# Patient Record
Sex: Male | Born: 1990 | Race: White | Hispanic: No | Marital: Married | Smoking: Former smoker
Health system: Southern US, Community
[De-identification: ages and names within clinical notes are randomized; demographics above are authoritative.]

## PROBLEM LIST (undated history)

## (undated) DIAGNOSIS — Z8249 Family history of ischemic heart disease and other diseases of the circulatory system: Secondary | ICD-10-CM

## (undated) DIAGNOSIS — E559 Vitamin D deficiency, unspecified: Secondary | ICD-10-CM

## (undated) DIAGNOSIS — I441 Atrioventricular block, second degree: Secondary | ICD-10-CM

## (undated) DIAGNOSIS — R002 Palpitations: Secondary | ICD-10-CM

## (undated) DIAGNOSIS — R0683 Snoring: Secondary | ICD-10-CM

## (undated) DIAGNOSIS — R61 Generalized hyperhidrosis: Secondary | ICD-10-CM

## (undated) DIAGNOSIS — R7989 Other specified abnormal findings of blood chemistry: Secondary | ICD-10-CM

## (undated) DIAGNOSIS — J302 Other seasonal allergic rhinitis: Secondary | ICD-10-CM

## (undated) DIAGNOSIS — E781 Pure hyperglyceridemia: Secondary | ICD-10-CM

## (undated) HISTORY — DX: Other seasonal allergic rhinitis: J30.2

## (undated) HISTORY — DX: Other specified abnormal findings of blood chemistry: R79.89

## (undated) HISTORY — DX: Atrioventricular block, second degree: I44.1

## (undated) HISTORY — DX: Pure hyperglyceridemia: E78.1

## (undated) HISTORY — DX: Snoring: R06.83

## (undated) HISTORY — DX: Family history of ischemic heart disease and other diseases of the circulatory system: Z82.49

## (undated) HISTORY — DX: Generalized hyperhidrosis: R61

## (undated) HISTORY — PX: LEG SURGERY: SHX1003

## (undated) HISTORY — DX: Vitamin D deficiency, unspecified: E55.9

## (undated) HISTORY — DX: Palpitations: R00.2

---
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
## 2012-01-07 ENCOUNTER — Ambulatory Visit: Payer: Managed Care, Other (non HMO)

## 2012-01-07 ENCOUNTER — Ambulatory Visit (INDEPENDENT_AMBULATORY_CARE_PROVIDER_SITE_OTHER): Payer: Managed Care, Other (non HMO) | Admitting: Internal Medicine

## 2012-01-07 VITALS — BP 113/66 | HR 57 | Temp 97.9°F | Resp 16 | Ht 67.5 in | Wt 145.8 lb

## 2012-01-07 DIAGNOSIS — M79672 Pain in left foot: Secondary | ICD-10-CM

## 2012-01-07 DIAGNOSIS — M79609 Pain in unspecified limb: Secondary | ICD-10-CM

## 2012-01-07 MED ORDER — MELOXICAM 15 MG PO TABS: 15.0000 mg | ORAL_TABLET | Freq: Every day | ORAL | Status: AC

## 2012-01-07 NOTE — Progress Notes (Signed)
  Subjective:    Patient ID: Joshua Guerra, male    DOB: May 02, 1991, 21 y.o.   MRN: [id number]   HPI in because he can't walk without pain today. He played basketball yesterday afternoon and sometime after dinner began to have pain in his foot. His left foot is not swollen but he can't walk without significant pain.    Review of Systemsnoncontributory     Objective:   Physical Examthe left foot is not swollen but is extremely tender over the first 2 metatarsals and in the interosseous space. There is pain with any range of motion particularly dorsiflexion. The ankle is normal.        [facility] reading (PRIMARY) by  Dr.[place] Festa  =WNL  Assessment & Plan:  Problem #1 pain secondary to foot sprain  He will be put in a postop shoe and treated with Mobic. He should return in 10 days if not fully functional. 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

## 2012-01-07 NOTE — Patient Instructions (Addendum)
Foot Sprain °The muscles and cord like structures which attach muscle to bone (tendons) that surround the feet are made up of units. A foot sprain can occur at the weakest spot in any of these units. This condition is most often caused by injury to or overuse of the foot, as from playing contact sports, or aggravating a previous injury, or from poor conditioning, or obesity. °SYMPTOMS °· Pain with movement of the foot.  °· Tenderness and swelling at the injury site.  °· Loss of strength is present in moderate or severe sprains.  °THE THREE GRADES OR SEVERITY OF FOOT SPRAIN ARE: °· Mild (Grade I): Slightly pulled muscle without tearing of muscle or tendon fibers or loss of strength.  °· Moderate (Grade II): Tearing of fibers in a muscle, tendon, or at the attachment to bone, with small decrease in strength.  °· Severe (Grade III): Rupture of the muscle-tendon-bone attachment, with separation of fibers. Severe sprain requires surgical repair. Often repeating (chronic) sprains are caused by overuse. Sudden (acute) sprains are caused by direct injury or over-use.  °DIAGNOSIS  °Diagnosis of this condition is usually by your own observation. If problems continue, a caregiver may be required for further evaluation and treatment. X-rays may be required to make sure there are not breaks in the bones (fractures) present. Continued problems may require physical therapy for treatment. °PREVENTION °· Use strength and conditioning exercises appropriate for your sport.  °· Warm up properly prior to working out.  °· Use athletic shoes that are made for the sport you are participating in.  °· Allow adequate time for healing. Early return to activities makes repeat injury more likely, and can lead to an unstable arthritic foot that can result in prolonged disability. Mild sprains generally heal in 3 to 10 days, with moderate and severe sprains taking 2 to 10 weeks. Your caregiver can help you determine the proper time required for  healing.  °HOME CARE INSTRUCTIONS  °· Apply ice to the injury for 15 to 20 minutes, 3 to 4 times per day. Put the ice in a plastic bag and place a towel between the bag of ice and your skin.  °· An elastic wrap (like an Ace bandage) may be used to keep swelling down.  °· Keep foot above the level of the heart, or at least raised on a footstool, when swelling and pain are present.  °· Try to avoid use other than gentle range of motion while the foot is painful. Do not resume use until instructed by your caregiver. Then begin use gradually, not increasing use to the point of pain. If pain does develop, decrease use and continue the above measures, gradually increasing activities that do not cause discomfort, until you gradually achieve normal use.  °· Use crutches if and as instructed, and for the length of time instructed.  °· Keep injured foot and ankle wrapped between treatments.  °· Massage foot and ankle for comfort and to keep swelling down. Massage from the toes up towards the knee.  °· Only take over-the-counter or prescription medicines for pain, discomfort, or fever as directed by your caregiver.  °SEEK IMMEDIATE MEDICAL CARE IF:  °· Your pain and swelling increase, or pain is not controlled with medications.  °· You have loss of feeling in your foot or your foot turns cold or blue.  °· You develop new, unexplained symptoms, or an increase of the symptoms that brought you to your caregiver.  °MAKE SURE YOU:  °·   Understand these instructions.  °· Will watch your condition.  °· Will get help right away if you are not doing well or get worse.  °Document Released: 05/06/2002 Document Revised: 07/27/2011 Document Reviewed: 07/03/2008 °[employer]® Patient Information ©2012 [employer]. °

## 2013-06-08 ENCOUNTER — Inpatient Hospital Stay (HOSPITAL_COMMUNITY)
Admission: EM | Admit: 2013-06-08 | Discharge: 2013-06-12 | DRG: 194 | Disposition: A | Discharge status: Home / Self Care | Payer: Managed Care, Other (non HMO) | Attending: Internal Medicine | Admitting: Internal Medicine

## 2013-06-08 ENCOUNTER — Encounter (HOSPITAL_COMMUNITY): Payer: Self-pay | Admitting: *Deleted

## 2013-06-08 ENCOUNTER — Emergency Department (HOSPITAL_COMMUNITY): Payer: Managed Care, Other (non HMO)

## 2013-06-08 DIAGNOSIS — M609 Myositis, unspecified: Secondary | ICD-10-CM

## 2013-06-08 DIAGNOSIS — IMO0001 Reserved for inherently not codable concepts without codable children: Secondary | ICD-10-CM

## 2013-06-08 DIAGNOSIS — J189 Pneumonia, unspecified organism: Principal | ICD-10-CM | POA: Diagnosis present

## 2013-06-08 DIAGNOSIS — F17200 Nicotine dependence, unspecified, uncomplicated: Secondary | ICD-10-CM | POA: Diagnosis present

## 2013-06-08 DIAGNOSIS — R233 Spontaneous ecchymoses: Secondary | ICD-10-CM | POA: Diagnosis present

## 2013-06-08 DIAGNOSIS — E871 Hypo-osmolality and hyponatremia: Secondary | ICD-10-CM | POA: Diagnosis present

## 2013-06-08 DIAGNOSIS — Z79899 Other long term (current) drug therapy: Secondary | ICD-10-CM

## 2013-06-08 DIAGNOSIS — D696 Thrombocytopenia, unspecified: Secondary | ICD-10-CM | POA: Diagnosis present

## 2013-06-08 DIAGNOSIS — B349 Viral infection, unspecified: Secondary | ICD-10-CM

## 2013-06-08 DIAGNOSIS — B9789 Other viral agents as the cause of diseases classified elsewhere: Secondary | ICD-10-CM | POA: Diagnosis present

## 2013-06-08 DIAGNOSIS — IMO0002 Reserved for concepts with insufficient information to code with codable children: Secondary | ICD-10-CM | POA: Diagnosis present

## 2013-06-08 DIAGNOSIS — R200 Anesthesia of skin: Secondary | ICD-10-CM

## 2013-06-08 DIAGNOSIS — M549 Dorsalgia, unspecified: Secondary | ICD-10-CM

## 2013-06-08 DIAGNOSIS — R209 Unspecified disturbances of skin sensation: Secondary | ICD-10-CM | POA: Diagnosis present

## 2013-06-08 DIAGNOSIS — R29898 Other symptoms and signs involving the musculoskeletal system: Secondary | ICD-10-CM

## 2013-06-08 LAB — URINALYSIS, ROUTINE W REFLEX MICROSCOPIC
Bilirubin Urine: NEGATIVE
Glucose, UA: NEGATIVE mg/dL
Hgb urine dipstick: NEGATIVE
Ketones, ur: NEGATIVE mg/dL
Specific Gravity, Urine: 1.026 (ref 1.005–1.030)
pH: 7 (ref 5.0–8.0)

## 2013-06-08 LAB — CBC WITH DIFFERENTIAL/PLATELET
Basophils Absolute: 0 10*3/uL (ref 0.0–0.1)
Eosinophils Relative: 0 % (ref 0–5)
HCT: 44.8 % (ref 39.0–52.0)
Lymphocytes Relative: 13 % (ref 12–46)
Lymphs Abs: 0.8 10*3/uL (ref 0.7–4.0)
MCV: 82.1 fL (ref 78.0–100.0)
Monocytes Absolute: 1 10*3/uL (ref 0.1–1.0)
Neutro Abs: 4.3 10*3/uL (ref 1.7–7.7)
Platelets: 127 10*3/uL — ABNORMAL LOW (ref 150–400)
RBC: 5.46 MIL/uL (ref 4.22–5.81)
WBC: 6.2 10*3/uL (ref 4.0–10.5)

## 2013-06-08 LAB — COMPREHENSIVE METABOLIC PANEL
ALT: 15 U/L (ref 0–53)
AST: 26 U/L (ref 0–37)
CO2: 21 mEq/L (ref 19–32)
Calcium: 9.7 mg/dL (ref 8.4–10.5)
Chloride: 100 mEq/L (ref 96–112)
GFR calc Af Amer: >90 mL/min (ref >90)
GFR calc non Af Amer: >90 mL/min (ref >90)
Glucose, Bld: 84 mg/dL (ref 70–99)
Sodium: 133 mEq/L — ABNORMAL LOW (ref 135–145)
Total Bilirubin: 0.5 mg/dL (ref 0.3–1.2)

## 2013-06-08 LAB — CSF CELL COUNT WITH DIFFERENTIAL: WBC, CSF: 2 /mm3 (ref 0–5)

## 2013-06-08 LAB — GRAM STAIN: Gram Stain: NONE SEEN

## 2013-06-08 LAB — HIV ANTIBODY (ROUTINE TESTING W REFLEX): HIV: NONREACTIVE

## 2013-06-08 LAB — SEDIMENTATION RATE: Sed Rate: 1 mm/hr (ref 0–16)

## 2013-06-08 LAB — RAPID URINE DRUG SCREEN, HOSP PERFORMED
Amphetamines: NOT DETECTED
Barbiturates: NOT DETECTED
Benzodiazepines: POSITIVE — AB
Cocaine: NOT DETECTED
Tetrahydrocannabinol: POSITIVE — AB

## 2013-06-08 LAB — CRYPTOCOCCAL ANTIGEN, CSF: Crypto Ag: NEGATIVE

## 2013-06-08 LAB — CK: Total CK: 140 U/L (ref 7–232)

## 2013-06-08 LAB — APTT: aPTT: 28 seconds (ref 24–37)

## 2013-06-08 LAB — MAGNESIUM: Magnesium: 1.9 mg/dL (ref 1.5–2.5)

## 2013-06-08 LAB — PROTEIN AND GLUCOSE, CSF: Glucose, CSF: 67 mg/dL (ref 43–76)

## 2013-06-08 MED ORDER — ENOXAPARIN SODIUM 40 MG/0.4ML ~~LOC~~ SOLN
40.0000 mg | SUBCUTANEOUS | Status: DC
  Administered 2013-06-09 – 2013-06-11 (×3): 40 mg via SUBCUTANEOUS
  Filled 2013-06-08 (×5): qty 0.4

## 2013-06-08 MED ORDER — HYDROMORPHONE HCL PF 2 MG/ML IJ SOLN
2.0000 mg | Freq: Once | INTRAMUSCULAR | Status: AC
  Administered 2013-06-08: 2 mg via INTRAMUSCULAR
  Filled 2013-06-08: qty 1

## 2013-06-08 MED ORDER — ONDANSETRON HCL 4 MG PO TABS: 4.0000 mg | ORAL_TABLET | Freq: Four times a day (QID) | ORAL | Status: DC | PRN

## 2013-06-08 MED ORDER — DIAZEPAM 2 MG PO TABS
2.0000 mg | ORAL_TABLET | Freq: Once | ORAL | Status: AC
  Administered 2013-06-08: 2 mg via ORAL
  Filled 2013-06-08: qty 1

## 2013-06-08 MED ORDER — KETOROLAC TROMETHAMINE 30 MG/ML IJ SOLN
30.0000 mg | Freq: Once | INTRAMUSCULAR | Status: AC
  Administered 2013-06-08: 30 mg via INTRAVENOUS
  Filled 2013-06-08: qty 1

## 2013-06-08 MED ORDER — HYDROMORPHONE HCL PF 1 MG/ML IJ SOLN
1.0000 mg | Freq: Once | INTRAMUSCULAR | Status: AC
  Administered 2013-06-08: 1 mg via INTRAVENOUS
  Filled 2013-06-08: qty 1

## 2013-06-08 MED ORDER — LORAZEPAM 2 MG/ML IJ SOLN
1.0000 mg | Freq: Four times a day (QID) | INTRAMUSCULAR | Status: DC | PRN
  Administered 2013-06-09 – 2013-06-11 (×6): 1 mg via INTRAVENOUS
  Filled 2013-06-08 (×6): qty 1

## 2013-06-08 MED ORDER — GADOBENATE DIMEGLUMINE 529 MG/ML IV SOLN
13.0000 mL | Freq: Once | INTRAVENOUS | Status: AC
  Administered 2013-06-08: 13 mL via INTRAVENOUS

## 2013-06-08 MED ORDER — SODIUM CHLORIDE 0.9 % IV BOLUS (SEPSIS)
1000.0000 mL | Freq: Once | INTRAVENOUS | Status: AC
  Administered 2013-06-08: 1000 mL via INTRAVENOUS

## 2013-06-08 MED ORDER — DIPHENHYDRAMINE HCL 25 MG PO CAPS
25.0000 mg | ORAL_CAPSULE | Freq: Four times a day (QID) | ORAL | Status: DC | PRN
  Administered 2013-06-08 – 2013-06-10 (×4): 25 mg via ORAL
  Filled 2013-06-08 (×4): qty 1

## 2013-06-08 MED ORDER — ONDANSETRON HCL 4 MG/2ML IJ SOLN: 4.0000 mg | Freq: Four times a day (QID) | INTRAMUSCULAR | Status: DC | PRN

## 2013-06-08 MED ORDER — CYCLOBENZAPRINE HCL 5 MG PO TABS
5.0000 mg | ORAL_TABLET | ORAL | Status: DC | PRN
  Administered 2013-06-08: 5 mg via ORAL
  Filled 2013-06-08: qty 1

## 2013-06-08 MED ORDER — HYDROCODONE-ACETAMINOPHEN 5-325 MG PO TABS
1.0000 | ORAL_TABLET | ORAL | Status: DC | PRN
  Administered 2013-06-09: 2 via ORAL
  Filled 2013-06-08: qty 2

## 2013-06-08 MED ORDER — SODIUM CHLORIDE 0.9 % IV SOLN
INTRAVENOUS | Status: DC
  Administered 2013-06-08 – 2013-06-10 (×4): via INTRAVENOUS

## 2013-06-08 MED ORDER — HYDROMORPHONE HCL PF 2 MG/ML IJ SOLN
2.0000 mg | INTRAMUSCULAR | Status: DC | PRN
  Administered 2013-06-08: 2 mg via INTRAVENOUS
  Administered 2013-06-08 (×2): 3 mg via INTRAVENOUS
  Administered 2013-06-09 (×3): 2 mg via INTRAVENOUS
  Administered 2013-06-09: 3 mg via INTRAVENOUS
  Administered 2013-06-09: 2 mg via INTRAVENOUS
  Administered 2013-06-10: 3 mg via INTRAVENOUS
  Administered 2013-06-10: 2 mg via INTRAVENOUS
  Filled 2013-06-08 (×2): qty 2
  Filled 2013-06-08 (×5): qty 1
  Filled 2013-06-08: qty 2
  Filled 2013-06-08: qty 1

## 2013-06-08 MED ORDER — MIDAZOLAM HCL 2 MG/2ML IJ SOLN
2.0000 mg | Freq: Once | INTRAMUSCULAR | Status: AC
  Administered 2013-06-08: 2 mg via INTRAVENOUS
  Filled 2013-06-08: qty 2

## 2013-06-08 MED ORDER — CAMPHOR-MENTHOL 0.5-0.5 % EX LOTN
TOPICAL_LOTION | CUTANEOUS | Status: DC | PRN
  Administered 2013-06-09 – 2013-06-10 (×2): via TOPICAL
  Filled 2013-06-08: qty 222

## 2013-06-08 MED ORDER — SODIUM CHLORIDE 0.9 % IV SOLN
Freq: Once | INTRAVENOUS | Status: AC
  Administered 2013-06-08: 09:00:00 via INTRAVENOUS

## 2013-06-08 MED ORDER — HYDROMORPHONE HCL PF 2 MG/ML IJ SOLN
INTRAMUSCULAR | Status: AC
  Filled 2013-06-08: qty 1

## 2013-06-08 MED ORDER — HYDROMORPHONE HCL PF 1 MG/ML IJ SOLN
1.0000 mg | INTRAMUSCULAR | Status: DC | PRN
  Filled 2013-06-08: qty 1

## 2013-06-08 NOTE — ED Notes (Signed)
Pt has attempted to urinate several times.  Sts unable to provide a sample.

## 2013-06-08 NOTE — ED Notes (Signed)
Neuro MD at bedside

## 2013-06-08 NOTE — ED Notes (Signed)
Patient transported to CT 

## 2013-06-08 NOTE — ED Provider Notes (Addendum)
History    CSN: [id number] Arrival date & time 06/08/13  0726  First MD Initiated Contact with Patient 06/08/13 [phone]     Chief Complaint  Patient presents with  . headache/neck pain/ back pain    (Consider location/radiation/quality/duration/timing/severity/associated sxs/prior Treatment) HPI Comments: Pt comes in with cc of headaches, neck pain, back pain, weakness, numbness. He has no medical hx, major surgical hx, and denies any medication use, illicit substance abuse. Pt's sx started about a week ago with a headache. The headaches have been mostly constant, waxing and waning, 7/10 at it's worst. The headaches are dull, and mostly in the posterior part of the head. No associated nausea, vomiting, visual complains, seizures, altered mental status, loss of consciousness. No known fevers. Pt has lower back pain, severe - that radiates upto his neck. The pain is sharp, and is actually distributing the entire back. The pain has been present for 3 days, and acutely got worse today, at it's worst it is at 10/10. No associated urinary incontinence, urinary retention, bowel incontinence. Pt does endorse having facial numbness starting today, bilateral upper extremity numbness, and bilateral leg weakness. No tick bites, no recent infection, no outdoor activity or travels, no family hx of any neuro medical conditions.      The history is provided by the patient.   History reviewed. No pertinent past medical history. Past Surgical History  Procedure Laterality Date  . Leg surgery      2 screws in left leg, 2010   History reviewed. No pertinent family history. History  Substance Use Topics  . Smoking status: Current Every Day Smoker -- 0.50 packs/day for 5 years    Types: Cigarettes  . Smokeless tobacco: Not on file     Comment: smokes about a 1/2 pack a day  . Alcohol Use: No    Review of Systems  Constitutional: Negative for fever, chills and activity change.  HENT: Positive for  neck pain and neck stiffness.   Eyes: Negative for visual disturbance.  Respiratory: Negative for cough, chest tightness and shortness of breath.   Cardiovascular: Negative for chest pain.  Gastrointestinal: Negative for abdominal distention.  Genitourinary: Negative for dysuria, enuresis and difficulty urinating.  Musculoskeletal: Positive for myalgias, back pain and gait problem. Negative for arthralgias.  Skin: Negative for rash.  Neurological: Positive for weakness, numbness and headaches. Negative for dizziness and light-headedness.  Psychiatric/Behavioral: Negative for confusion.    Allergies  Review of patient's allergies indicates no known allergies.  Home Medications   Current Outpatient Rx  Name  Route  Sig  Dispense  Refill  . diphenhydrAMINE (BENADRYL) 25 mg capsule   Oral   Take 25 mg by mouth every 6 (six) hours as needed for itching.         Marland Kitchen ibuprofen (ADVIL,MOTRIN) 200 MG tablet   Oral   Take 400 mg by mouth every 6 (six) hours as needed for pain.         Marland Kitchen PRESCRIPTION MEDICATION   Oral   Take 1 tablet by mouth once. A muscle relaxer or something for pain          BP 103/73  Pulse 100  Temp(Src) 98.7 F (37.1 C) (Oral)  Resp 22  SpO2 100% Physical Exam  Vitals reviewed. Constitutional: He is oriented to person, place, and time. He appears well-developed and well-nourished.  HENT:  Head: Normocephalic and atraumatic.  Eyes: Conjunctivae and EOM are normal. Pupils are equal, round, and reactive to light.  Neck: Normal range of motion. Neck supple. No JVD present.  Cardiovascular: Normal rate, regular rhythm and normal heart sounds.   Pulmonary/Chest: Effort normal and breath sounds normal. No respiratory distress. He has no wheezes.  Abdominal: Soft. Bowel sounds are normal. He exhibits no distension. There is no tenderness. There is no rebound and no guarding.  Musculoskeletal:  Pt has point tenderness in the entire spine.  Neurological: He is  alert and oriented to person, place, and time. No cranial nerve deficit. Coordination normal.  Pt has 3+/5 lower extremity strength bilaterally. Pt has 4+/5 upper extremity strength bilaterally. Cerebellar exam is normal (finger to nose). Sensory exam normal for bilateral lower extremities - and patient is able to discriminate between sharp and dull. Sensory exam for upper extremity bilaterally reveals subjective numbness, but patient able to discriminate between sharp and dull. Facial numbness, but otherwise no motor findings.  Skin: Skin is warm and dry.  Flushed, warm, blanching erythematous rash to the entire torso    ED Course  LUMBAR PUNCTURE Date/Time: 06/08/2013 9:26 AM Performed by: Derwood Kaplan Authorized by: Derwood Kaplan Consent: Verbal consent obtained. Risks and benefits: risks, benefits and alternatives were discussed Consent given by: patient Patient understanding: patient states understanding of the procedure being performed Site marked: the operative site was marked Imaging studies: imaging studies available Required items: required blood products, implants, devices, and special equipment available Time out: Immediately prior to procedure a "time out" was called to verify the correct patient, procedure, equipment, support staff and site/side marked as required. Indications: evaluation for infection Anesthesia: local infiltration Local anesthetic: lidocaine 1% with epinephrine Anesthetic total: 2 ml Patient sedated: yes Sedation type: anxiolysis Sedatives: midazolam Sedation start date/time: 06/08/2013 9:07 AM Sedation end date/time: 06/08/2013 9:27 AM Vitals: Vital signs were monitored during sedation. Preparation: Patient was prepped and draped in the usual sterile fashion. Lumbar space: L4-L5 interspace Patient's position: sitting Needle gauge: 18 Fluid appearance: clear Tubes of fluid: 4 Total volume: 10 ml Post-procedure: site cleaned and adhesive  bandage applied Patient tolerance: Patient tolerated the procedure well with no immediate complications.   (including critical care time) Labs Reviewed  CSF CULTURE  GRAM STAIN  CBC WITH DIFFERENTIAL  COMPREHENSIVE METABOLIC PANEL  PROTIME-INR  APTT  URINALYSIS, ROUTINE W REFLEX MICROSCOPIC  URINE RAPID DRUG SCREEN (HOSP PERFORMED)  SEDIMENTATION RATE  CSF CELL COUNT WITH DIFFERENTIAL  PROTEIN AND GLUCOSE, CSF  CRYPTOCOCCAL ANTIGEN, CSF  OLIGOCLONAL BANDS, CSF + SERM  CSF IGG INDEX  HERPES SIMPLEX VIRUS(HSV) DNA BY PCR  VARICELLA-ZOSTER BY PCR  HIV ANTIBODY (ROUTINE TESTING)   No results found. No diagnosis found.  MDM  DDx includes:  - Spinal cord compression - Conus medullaris - Epidural hematoma - Epidural abscess - Myelitis - Spondylitises/ spondylosis - Musculoskeletal pain - GBS - Encephalitis - Infarct  Pt comes in with cc of diffuse back pain, headaches, upper extremity numbness, with lower extremity weakness. No fevers. Screen for tick borne dz, family hx, autoimmune condition are all negative. Back pain worse with palpation.  Favored some sort of neuromuscular disorder or infection vs. Encephalitis. Spoke with Neurology immediately, who recommends CSF evaluation, and then MRI. Pt to be transferred to [facility] for MRI and Neuro evaluation, Dr. Petra Kuba on for Neurology.   Derwood Kaplan, MD 06/08/13 [phone]  S/p LP Dr. Fredderick Phenix at [facility] aware of the patient. Dr. Petra Kuba to see patient in the ED.  Derwood Kaplan, MD 06/08/13 0930

## 2013-06-08 NOTE — H&P (Signed)
[facility] History and Physical  Joshua Guerra QIO:[id number] DOB: 1991/10/29 DOA: 06/08/2013  Referring physician: ED physician PCP: No primary provider on file.   Chief Complaint: Generalized weakness and numbness  HPI:  Pt is 22 yo male who presented to [facility] ED with main concern of one week duration of mostly posterior part of the head pain, constant and dull, 7/10 in severity with no specific radiating symptoms, no specific alleviating or aggravating symptoms, associated with neck pain, upper and lower back pain, generalized body numbness. He explains that lower back pain radiates to the upper back and neck area, its sharp and intermittent, 10/10 in severity. He explains that pain is worse today. He denies any similar events in the past, no fevers, chills, no recent sick contacts or exposures, no visual changes, no dizziness, no specific focal neurological symptoms, no abdominal or urinary concerns, no recent travelling, he lives at home with parents.  In ED, pt in persistent pain, upper and lower extremity weakness noted on exam, numbness. [facility] asked to admit for further evaluation and management.   Assessment and Plan:  Back pain with upper and lower extremity weakness and numbness - unclear etiology at this time, possibly viral but no clear source identifiable at this time - will admit pt to medical floor for observation and pain management - appreciate neurology involvement - will provide supportive care with analgesia as needed - PT evaluation ordered  Code Status: Full Family Communication: Pt at bedside Disposition Plan: Admit to medical floor   Review of Systems:  Constitutional: Negative for fever, chills. Negative for diaphoresis.  HENT: Negative for hearing loss, ear pain, nosebleeds, congestion, sore throat, neck pain, tinnitus and ear discharge.   Eyes: Negative for blurred vision, double vision, photophobia, pain, discharge and redness.  Respiratory:  Negative for cough, hemoptysis, sputum production, shortness of breath, wheezing and stridor.   Cardiovascular: Negative for chest pain, palpitations, orthopnea, claudication and leg swelling.  Gastrointestinal: Negative for nausea, vomiting and abdominal pain. Negative for heartburn, constipation, blood in stool and melena.  Genitourinary: Negative for dysuria, urgency, frequency, hematuria and flank pain.  Musculoskeletal: Negative for joint pain and falls.  Skin: Negative for itching and rash.  Neurological:  Per HPI Endo/Heme/Allergies: Negative for environmental allergies and polydipsia. Does not bruise/bleed easily.  Psychiatric/Behavioral: Negative for suicidal ideas. The patient is not nervous/anxious.     No past medical history   Past Surgical History  Procedure Laterality Date  . Leg surgery      2 screws in left leg, 2010    Social History:  reports that he has been smoking Cigarettes.  He has a 2.5 pack-year smoking history. He does not have any smokeless tobacco history on file. He reports that he does not drink alcohol or use illicit drugs.  No Known Allergies  No family history of neurological problems.   Prior to Admission medications   Medication Sig Start Date End Date Taking? Authorizing Provider  diphenhydrAMINE (BENADRYL) 25 mg capsule Take 25 mg by mouth every 6 (six) hours as needed for itching.   Yes Historical Provider, MD  ibuprofen (ADVIL,MOTRIN) 200 MG tablet Take 400 mg by mouth every 6 (six) hours as needed for pain.   Yes Historical Provider, MD  PRESCRIPTION MEDICATION Take 1 tablet by mouth once. A muscle relaxer or something for pain   Yes Historical Provider, MD    Physical Exam: Filed Vitals:   06/08/13 0732 06/08/13 0806 06/08/13 0928 06/08/13 0945  BP:  103/73  140/78   Pulse: 100  83 77  Temp: 98.7 F (37.1 C)     TempSrc: Oral     Resp: 22  14 17   SpO2: 99% 100% 93% 95%    Physical Exam  Constitutional: Appears well-developed and  well-nourished. No distress.  HENT: Normocephalic. External right and left ear normal. Oropharynx is clear and moist.  Eyes: Conjunctivae and EOM are normal. PERRLA, no scleral icterus.  Neck: Normal ROM. Neck supple. No JVD. No tracheal deviation. No thyromegaly.  CVS: RRR, S1/S2 +, no murmurs, no gallops, no carotid bruit.  Pulmonary: Effort and breath sounds normal, no stridor, rhonchi, wheezes, rales.  Abdominal: Soft. BS +,  no distension, tenderness, rebound or guarding.  Musculoskeletal: Normal range of motion. No edema and no tenderness.  Lymphadenopathy: No lymphadenopathy noted, cervical, inguinal. Neuro: A&O x3, upper extremity strength 3/5 and lower extremity strength 4/5 bilaterally, numbness in facial area and in lower extremities but pt able to differentiate between sharp and soft touch, normal  f-t-n and h-t-s, no ataxia, CN II - XII grossly intact except facial numbness  Skin: Skin is warm and dry. No rash noted. Not diaphoretic. No erythema. No pallor.  Psychiatric: Normal mood and affect. Behavior, judgment, thought content normal.   Labs on Admission:  Basic Metabolic Panel:  Recent Labs Lab 06/08/13 0830  NA 133*  K 4.0  CL 100  CO2 21  GLUCOSE 84  BUN 11  CREATININE 0.90  CALCIUM 9.7   Liver Function Tests:  Recent Labs Lab 06/08/13 0830  AST 26  ALT 15  ALKPHOS 108  BILITOT 0.5  PROT 7.7  ALBUMIN 4.3   CBC:  Recent Labs Lab 06/08/13 0830  WBC 6.2  NEUTROABS 4.3  HGB 16.0  HCT 44.8  MCV 82.1  PLT 127*   Cardiac Enzymes:  Recent Labs Lab 06/08/13 1340  CKTOTAL 140   Radiological Exams on Admission: Ct Head Wo Contrast 06/08/2013  Hypoattenuation within the left pons and temporal tip, likely artifactual but acute/subacute ischemic changes not excluded. [name] 06/08/2013  Essentially negative MRI the brain.  [name] 06/08/2013   Negative motion degraded exam as noted above.   Mr Thoracic and Lumbar  Spine W Wo Contrast 06/08/2013  Degenerative changes most prominent T8-9 and T9-10 as detailed above.    EKG: Normal sinus rhythm, no ST/T wave changes  Debbora Presto, MD  [facility] Pager [phone]  If 7PM-7AM, please contact night-coverage [email or web address] Password [email or web address] 06/08/2013, 3:16 PM

## 2013-06-08 NOTE — ED Notes (Addendum)
See previous note, in addition patient states that hands and arms tingle, and he feels nausea and chills in the morning which then go away. Patient describes pain as "electric shock" traveling from hip to neck, with weakness in his legs. Patient currently shivering, red flushing present on the chest. Patient reports posterior headache lasting one week. States that "it was hard to turn a door knob" due to the pain. Rates headache 7/10 pain. Patient reports new onset today of facial numbness. Patient reports that he cannot raise his eyebrows.

## 2013-06-08 NOTE — Consult Note (Addendum)
Neurology Consultation Reason for Consult: Back pain, numbness, weakness Referring Physician: Rhunette Croft, A  CC: Back pain  History is obtained from: Patient, family  HPI: Joshua Guerra is a 22 y.o. male with no significant past medical history who presents with several days of weakness, chills, then today developed back pain causing him to present to the emergency room. He also complained of some tingling of his face and bilateral hands. Given the weakness and the pain, there was concern for transverse myelitis or myelopathy and therefore workup was performed in the emergency department consisting of an MRI brain C-spine and T. spine, as well as lumbar puncture all of which has been unrevealing.  He states at this time his tingling/numbness has completely resolved at this time.  ROS: A 14 point ROS was performed and is negative except as noted in the HPI.  History reviewed. No pertinent past medical history.  Family History: Sister-kowasakis Mother-Sjogren's  Social History: Tob: Positive smoker  Exam: Current vital signs: BP 140/78  Pulse 77  Temp(Src) 98.7 F (37.1 C) (Oral)  Resp 17  SpO2 95% Vital signs in last 24 hours: Temp:  [98.7 F (37.1 C)] 98.7 F (37.1 C) (07/12 0732) Pulse Rate:  [77-100] 77 (07/12 0945) Resp:  [14-22] 17 (07/12 0945) BP: (103-140)/(73-78) 140/78 mmHg (07/12 0928) SpO2:  [93 %-100 %] 95 % (07/12 0945)  General: In bed, appears in pain CV: Regular rate and rhythm Mental Status: Patient is awake, alert, oriented to person, place, month, year, and situation. Immediate and remote memory are intact. Patient is able to give a clear and coherent history. Able to spell world backwards without difficulty no signs of aphasia or neglect Cranial Nerves: II: Visual Fields are full. Pupils are equal, round, and reactive to light.  Discs are without papilledema. Guerra,IV, VI: EOMI without ptosis or diploplia.  V: Facial sensation is symmetric  to temperature VII: Facial movement is symmetric.  VIII: hearing is intact to voice X: Uvula elevates symmetrically XI: Shoulder shrug is symmetric. XII: tongue is midline without atrophy or fasciculations.  Motor: Tone is normal. Bulk is normal. 5/5 strength was present in arms, he is limited by back pain in the proximal hip muscles bilaterally, However distally he does have 5/5 strength Sensory: Sensation is symmetric to light touch and temperature in the arms and legs. Deep Tendon Reflexes: 2+ and symmetric in the biceps and patellae and ankle Cerebellar: No dysmetria Gait: Not tested due to severe patient discomfort  I have reviewed labs in epic and the results pertinent to this consultation are: CSF results are benign Mild hyponatremia  I have reviewed the images obtained: MRI brain, C-spine, T-spine-I do not feel that anything on this explains his symptoms. He has very mild disc protrusion in the thoracic spine, but there is good CSF surrounding the cord at this level.  Impression: 22 year old male who presents mostly with a complaint of back pain. I suspect that the tingling he experienced was secondary to hyperventilation due to pain. His lower extremity venous at this time seems mostly limited by pain, and I suspect that the generalized sense of weakness that he had prior this could be part of a viral syndrome.  There is no evidence of a peripheral process given the intact reflexes and sensory exam, and no evidence of a central process given his extensive evaluation with LP and MRI of his neuraxis.  I suspect that this represents a viral syndrome, though rheumatological evaluation may be indicated given  his family history.  Recommendations: 1) no further recommendations from a neurological standpoint at this time, if the patient were to have progressive neurological symptoms, then further workup could be initiated. 2) Will follow with you.    Ritta Slot, MD [facility] [phone]  If 7pm- 7am, please page neurology on call at [phone].

## 2013-06-08 NOTE — ED Notes (Signed)
MD at bedside. 

## 2013-06-08 NOTE — ED Notes (Signed)
Pt is aware that we need urine and given a urinal.  Pt unable to provide a sample at this time.

## 2013-06-08 NOTE — ED Notes (Addendum)
Pt reports headaches/migraines, muscle aches, chills day and night, neck and pain back, 9/10, all over body itching. Pt has blood shot eyes, reports that started yesterday, pt reports he has a tender spot on his right upper thigh with 2 little marks with no discoloration. Pt reports he took a muscle relaxer around 0645.   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  Reports all the symptoms started at the same time while he was at work 3 days ago  Unrelated pt reports he stopped drinking soda 3-4 days ago. Normally drinks 4/day.

## 2013-06-08 NOTE — ED Notes (Signed)
Patient aware that urine specimen is needed, unable to void at this time.

## 2013-06-09 ENCOUNTER — Inpatient Hospital Stay (HOSPITAL_COMMUNITY): Payer: Managed Care, Other (non HMO)

## 2013-06-09 LAB — BASIC METABOLIC PANEL
BUN: 8 mg/dL (ref 6–23)
Calcium: 8.9 mg/dL (ref 8.4–10.5)
Creatinine, Ser: 0.87 mg/dL (ref 0.50–1.35)
GFR calc non Af Amer: >90 mL/min (ref >90)
Glucose, Bld: 107 mg/dL — ABNORMAL HIGH (ref 70–99)

## 2013-06-09 LAB — CBC
Hemoglobin: 14.3 g/dL (ref 13.0–17.0)
MCH: 29.4 pg (ref 26.0–34.0)
MCHC: 35.9 g/dL (ref 30.0–36.0)
Platelets: 124 10*3/uL — ABNORMAL LOW (ref 150–400)

## 2013-06-09 LAB — RAPID STREP SCREEN (MED CTR MEBANE ONLY): Streptococcus, Group A Screen (Direct): NEGATIVE

## 2013-06-09 LAB — HERPES SIMPLEX VIRUS(HSV) DNA BY PCR: HSV 2 DNA: NOT DETECTED

## 2013-06-09 MED ORDER — LEVOFLOXACIN IN D5W 500 MG/100ML IV SOLN
500.0000 mg | INTRAVENOUS | Status: DC
  Administered 2013-06-09: 500 mg via INTRAVENOUS
  Filled 2013-06-09 (×3): qty 100

## 2013-06-09 MED ORDER — ACETAMINOPHEN 325 MG PO TABS
650.0000 mg | ORAL_TABLET | ORAL | Status: DC | PRN
  Administered 2013-06-09: 650 mg via ORAL
  Filled 2013-06-09: qty 2

## 2013-06-09 MED ORDER — IOHEXOL 300 MG/ML  SOLN
100.0000 mL | Freq: Once | INTRAMUSCULAR | Status: AC | PRN
  Administered 2013-06-09: 100 mL via INTRAVENOUS

## 2013-06-09 MED ORDER — DOXYCYCLINE HYCLATE 100 MG PO TABS
100.0000 mg | ORAL_TABLET | Freq: Two times a day (BID) | ORAL | Status: DC
  Administered 2013-06-09 – 2013-06-11 (×5): 100 mg via ORAL
  Filled 2013-06-09 (×6): qty 1

## 2013-06-09 MED ORDER — IOHEXOL 300 MG/ML  SOLN
50.0000 mL | Freq: Once | INTRAMUSCULAR | Status: AC | PRN
  Administered 2013-06-09: 50 mL via ORAL

## 2013-06-09 NOTE — Progress Notes (Signed)
5:02 am  Patient with recurrent inability to urinate.  Placed foley. Spiked fever 103.1 (other vital signs stable).  Ordered blood cultures and tylenol.  Algis Downs, PA-C [facility] Pager: [phone]

## 2013-06-09 NOTE — Progress Notes (Signed)
Pt requested to have foley removed.  Pt was also concerned about results of CT of abdomen. [name] notified, received order. Per [name], okay for this RN to let pt know that CT of abdomen was negative. Foley removed, WNL. Pt able to void a small amount. This RN to continue to monitor. Eugene Garnet RN

## 2013-06-09 NOTE — Progress Notes (Signed)
Patient had an eventful night . Foley catheter was placed due to retention/inability to void. Febrile to 103.1 and had blood cultures drawn and received Tylenol. Temp down this morning

## 2013-06-09 NOTE — Progress Notes (Signed)
Subjective: No numbness, continues to have back pain.   Exam: Filed Vitals:   06/09/13 1809  BP:   Pulse:   Temp: 99.3 F (37.4 C)  Resp:    Gen: In bed, NAD MS: Awake Alert, oriented x 3.  ZO:XWRUE, EOMI Motor: 5/5 throughout, though guards with hip flexion.  Sensory:intact to LT and temperature.   Impression: 22 year old male who presents mostly with a complaint of back pain. I suspect that the tingling he experienced was secondary to hyperventilation due to pain. He does not have any objective weakness, and his sensation is intact. At this time, I do not think that there is a neurological process involved. I do wonder if a rheumatological evaluation could be helpful if no clear infectious source given family history.    Recommendations: 1) Neurology will sign off at this time, please call with any questions   Ritta Slot, MD [facility] [phone]  If 7pm- 7am, please page neurology on call at [phone].

## 2013-06-09 NOTE — Progress Notes (Signed)
Patient ID: Joshua Guerra, male   DOB: [birth year], 22 y.o.   MRN: [id number]  [facility] PROGRESS NOTE  SUZANNE GARBERS III HYQ:[id number] DOB: 1991/03/26 DOA: 06/08/2013 PCP: No primary provider on file.  Brief narrative: Pt is 22 yo male who presented to [facility] ED with main concern of one week duration of mostly posterior part of the head pain, constant and dull, 7/10 in severity with no specific radiating symptoms, no specific alleviating or aggravating symptoms, associated with neck pain, upper and lower back pain, generalized body numbness. He explains that lower back pain radiates to the upper back and neck area, its sharp and intermittent, 10/10 in severity. He explains that pain is worse today. He denies any similar events in the past, no fevers, chills, no recent sick contacts or exposures, no visual changes, no dizziness, no specific focal neurological symptoms, no abdominal or urinary concerns, no recent travelling, he lives at home with parents.   In ED, pt in persistent pain, upper and lower extremity weakness noted on exam, numbness. [facility] asked to admit for further evaluation and management.   Assessment and Plan:  Back pain with upper and lower extremity weakness and numbness  - unclear etiology at this time, possibly viral but no clear source identifiable at this time  - slight clinical improvement over 24 hour period - appreciate neurology involvement  - given new onset of fever will proceed with FUO work up - will start with EBV, mono screen, rapid strep test - small diffuse petechia noted on the lower and upper extremities, will ask for RSMF and Lyme dx scan, will start empiric doxycycline  - follow up on HIV, CT abdomen and pelvis with contrast - continue supportive care Hyponatremia - unclear etiology and possibly related to viral etiology vs RSMF, Lyme disease   Consultants:  Neurology   Procedures/Studies:  Ct Head Wo Contrast 06/08/2013  Hypoattenuation within the left pons and temporal tip, likely artifactual but acute/subacute ischemic changes not excluded.   [name] 06/08/2013 Essentially negative MRI the brain.   [name] 06/08/2013 Negative motion degraded exam as noted above.   [name] 06/08/2013 Degenerative changes most prominent T8-9 and T9-10 as detailed above.   Antibiotics:  Doxycycline 7/13 -->   Code Status: Full Family Communication: Pt at bedside Disposition Plan: Home when medically stable  HPI/Subjective: No events overnight.   Objective: Filed Vitals:   06/08/13 1631 06/08/13 2106 06/09/13 0448 06/09/13 0740  BP: 135/64 119/56 125/75   Pulse: 58 72 88   Temp: 98.8 F (37.1 C) 98.3 F (36.8 C) 103.1 F (39.5 C) 99.1 F (37.3 C)  TempSrc: Oral Oral Oral Oral  Resp: 16 18 19    Height: 5\' 9"  (1.753 m)     Weight: 71.1 kg (156 lb 12 oz)     SpO2: 100% 98% 100%     Intake/Output Summary (Last 24 hours) at 06/09/13 0918 Last data filed at 06/09/13 0449  Gross per 24 hour  Intake    120 ml  Output   1800 ml  Net  -1680 ml    Exam:   General:  Pt is alert, follows commands appropriately, not in acute distress, diffuse small petechia upper and lower extremities   Cardiovascular: Regular rate and rhythm, S1/S2, no murmurs, no rubs, no gallops  Respiratory: Clear to auscultation bilaterally, no wheezing, no crackles, no rhonchi  Abdomen: Soft, non tender,  non distended, bowel sounds present, no guarding  Extremities: No edema, pulses DP and PT palpable bilaterally  Neuro: Grossly nonfocal  Data Reviewed: Basic Metabolic Panel:  Recent Labs Lab 06/08/13 0830 06/08/13 1650 06/09/13 0450  NA 133*  --  131*  K 4.0  --  3.6  CL 100  --  98  CO2 21  --  24  GLUCOSE 84  --  107*  BUN 11  --  8  CREATININE 0.90  --  0.87  CALCIUM 9.7  --  8.9  MG  --  1.9  --   PHOS  --  2.5  --    Liver Function  Tests:  Recent Labs Lab 06/08/13 0830  AST 26  ALT 15  ALKPHOS 108  BILITOT 0.5  PROT 7.7  ALBUMIN 4.3   CBC:  Recent Labs Lab 06/08/13 0830 06/09/13 0450  WBC 6.2 6.2  NEUTROABS 4.3  --   HGB 16.0 14.3  HCT 44.8 39.8  MCV 82.1 81.9  PLT 127* 124*   Cardiac Enzymes:  Recent Labs Lab 06/08/13 1340  CKTOTAL 140     Recent Results (from the past 240 hour(s))  CSF CULTURE     Status: None   Collection Time    06/08/13  9:25 AM      Result Value Range Status   Specimen Description CSF   Final   Special Requests NONE   Final   Gram Stain     Final   Value: NO WBC SEEN     NO ORGANISMS SEEN     Gram Stain Report Called to,Read Back By and Verified With: Gram Stain Report Called to,Read Back By and Verified With: N DAVIS RN AT 1040 ON [id number] BY C BONGEL Performed at [facility]   Culture PENDING   Incomplete   Report Status PENDING   Incomplete  GRAM STAIN     Status: None   Collection Time    06/08/13  9:25 AM      Result Value Range Status   Specimen Description CSF   Final   Special Requests NONE   Final   Gram Stain     Final   Value: NO WBC SEEN     NO ORGANISMS SEEN     Gram Stain Report Called to,Read Back By and Verified With: N.DAVIS RN AT 1040 ON [id number] BY C.BONGEL   Report Status 06/08/2013 FINAL   Final     Scheduled Meds: . doxycycline  100 mg Oral Q12H  . enoxaparin (LOVENOX) injection  40 mg Subcutaneous Q24H   Continuous Infusions: . sodium chloride 75 mL/hr at 06/08/13 2232     Debbora Presto, MD  [facility] Pager [phone]  If 7PM-7AM, please contact night-coverage [email or web address] Password [email or web address] 06/09/2013, 9:18 AM   LOS: 1 day

## 2013-06-10 LAB — CBC
MCHC: 35.3 g/dL (ref 30.0–36.0)
RDW: 12.5 % (ref 11.5–15.5)
WBC: 6.1 10*3/uL (ref 4.0–10.5)

## 2013-06-10 LAB — BASIC METABOLIC PANEL
GFR calc Af Amer: >90 mL/min (ref >90)
GFR calc non Af Amer: >90 mL/min (ref >90)
Potassium: 3.8 mEq/L (ref 3.5–5.1)
Sodium: 138 mEq/L (ref 135–145)

## 2013-06-10 LAB — B. BURGDORFI ANTIBODIES: B burgdorferi Ab IgG+IgM: 0.22 {ISR}

## 2013-06-10 MED ORDER — OXYCODONE-ACETAMINOPHEN 5-325 MG PO TABS
1.0000 | ORAL_TABLET | ORAL | Status: DC | PRN
  Administered 2013-06-10 – 2013-06-11 (×4): 2 via ORAL
  Filled 2013-06-10 (×5): qty 2

## 2013-06-10 MED ORDER — HYDROMORPHONE HCL PF 2 MG/ML IJ SOLN
3.0000 mg | INTRAMUSCULAR | Status: DC | PRN
  Administered 2013-06-10 (×3): 3 mg via INTRAVENOUS
  Filled 2013-06-10 (×3): qty 2

## 2013-06-10 MED ORDER — HYDROXYZINE HCL 25 MG PO TABS
25.0000 mg | ORAL_TABLET | ORAL | Status: DC | PRN
  Administered 2013-06-10 – 2013-06-12 (×3): 25 mg via ORAL
  Filled 2013-06-10 (×3): qty 1

## 2013-06-10 MED ORDER — DIPHENHYDRAMINE HCL 50 MG/ML IJ SOLN
25.0000 mg | Freq: Four times a day (QID) | INTRAMUSCULAR | Status: DC | PRN
  Administered 2013-06-10 – 2013-06-12 (×2): 25 mg via INTRAVENOUS
  Filled 2013-06-10 (×2): qty 1

## 2013-06-10 MED ORDER — ZOLPIDEM TARTRATE 10 MG PO TABS: 10.0000 mg | ORAL_TABLET | Freq: Every evening | ORAL | Status: DC | PRN

## 2013-06-10 MED ORDER — DIPHENHYDRAMINE HCL 25 MG PO CAPS
25.0000 mg | ORAL_CAPSULE | ORAL | Status: DC | PRN
  Filled 2013-06-10: qty 1

## 2013-06-10 MED ORDER — DEXTROSE 5 % IV SOLN
1.0000 g | INTRAVENOUS | Status: DC
  Administered 2013-06-10 – 2013-06-11 (×2): 1 g via INTRAVENOUS
  Filled 2013-06-10 (×3): qty 10

## 2013-06-10 MED ORDER — DEXTROSE 5 % IV SOLN
500.0000 mg | INTRAVENOUS | Status: DC
  Administered 2013-06-10 – 2013-06-11 (×2): 500 mg via INTRAVENOUS
  Filled 2013-06-10 (×3): qty 500

## 2013-06-10 NOTE — Care Management Note (Signed)
UR Complete.   Roxy Manns Alanta Scobey,RN,BSN [phone]

## 2013-06-10 NOTE — Evaluation (Signed)
Physical Therapy Evaluation Patient Details Name: Joshua Guerra MRN: [id number] DOB: 29-May-1991 Today's Date: 06/10/2013 Time: 0930-1006 PT Time Calculation (min): 36 min  PT Assessment / Plan / Recommendation History of Present Illness  Pt is 22 yo male who presented to [facility] ED with main concern of one week duration of mostly posterior part of the head pain, constant and dull, 7/10 in severity with no specific radiating symptoms, no specific alleviating or aggravating symptoms, associated with neck pain, upper and lower back pain, generalized body numbness. He explains that lower back pain radiates to the upper back and neck area, its sharp and intermittent, 10/10 in severity. He explains that pain is worse today. He denies any similar events in the past, no fevers, chills, no recent sick contacts or exposures, no visual changes, no dizziness, no specific focal neurological symptoms, no abdominal or urinary concerns, no recent travelling, he lives at home with parents.22 year old male who presents mostly with a complaint of back pain. I suspect that the tingling he experienced was secondary to hyperventilation due to pain. He does not have any objective weakness, and his sensation is intact.Neurology consulted and reports  At this time that there is not  a neurological process involved  suspects that the tingling he experienced was secondary to hyperventilation due to pain. He does not have any objective weakness, and his sensation is intact. neurological process involved.and does  wonder if a rheumatological evaluation could be helpful if no clear infectious source given family history.   Clinical Impression  Pt with noted spasm onset in back while mobilizing, almost incapacitating. Pt did push through several events and ambulated x 80 ft with RW. Pt will benefit from PT while in acute care to return to independence.   PT Assessment  Patient needs continued PT services    Follow Up  Recommendations  Home health PT    Does the patient have the potential to tolerate intense rehabilitation      Barriers to Discharge        Equipment Recommendations  None recommended by PT    Recommendations for Other Services     Frequency Min 3X/week    Precautions / Restrictions Precautions Precaution Comments: spasms of back,   Pertinent Vitals/Pain 10/10 back spasms. RN in to give meds.      Mobility  Bed Mobility Bed Mobility: Supine to Sit Supine to Sit: 4: Min assist Details for Bed Mobility Assistance: guardedly, difficult with flexing at hips initially  Transfers Transfers: Sit to Stand;Stand to Sit Sit to Stand: 4: Min guard;5: Supervision;With upper extremity assist;From elevated surface;From bed Stand to Sit: 5: Supervision;To bed;With upper extremity assist;To elevated surface Details for Transfer Assistance: pt tends to stand straight with decreased trunk flexion Ambulation/Gait Ambulation/Gait Assistance: 4: Min assist Ambulation Distance (Feet): 80 Feet (and 20') Assistive device: Rolling walker Ambulation/Gait Assistance Details: cues for rolling  vs picking up. Gait Pattern: Step-through pattern;Decreased trunk rotation;Decreased stride length;Decreased hip/knee flexion - right;Decreased hip/knee flexion - left Gait velocity: decr.    Exercises Other Exercises Other Exercises: instructed to attempt knee to chest in supine, trunk rotations in sitting and forward flexion in sitting.   PT Diagnosis: Difficulty walking;Acute pain  PT Problem List: Decreased range of motion;Decreased activity tolerance;Decreased mobility;Pain PT Treatment Interventions: DME instruction;Gait training;Stair training;Functional mobility training;Therapeutic activities;Therapeutic exercise;Patient/family education     PT Goals(Current goals can be found in the care plan section) Acute Rehab PT Goals Patient Stated Goal: I want to get rid  of these spasms. PT Goal  Formulation: With patient/family Time For Goal Achievement: 06/24/13 Potential to Achieve Goals: Good  Visit Information  Last PT Received On: 06/10/13 Assistance Needed: +1 History of Present Illness: Pt is 22 yo male who presented to [facility] ED with main concern of one week duration of mostly posterior part of the head pain, constant and dull, 7/10 in severity with no specific radiating symptoms, no specific alleviating or aggravating symptoms, associated with neck pain, upper and lower back pain, generalized body numbness. He explains that lower back pain radiates to the upper back and neck area, its sharp and intermittent, 10/10 in severity. He explains that pain is worse today. He denies any similar events in the past, no fevers, chills, no recent sick contacts or exposures, no visual changes, no dizziness, no specific focal neurological symptoms, no abdominal or urinary concerns, no recent travelling, he lives at home with parents.22 year old male who presents mostly with a complaint of back pain. I suspect that the tingling he experienced was secondary to hyperventilation due to pain. He does not have any objective weakness, and his sensation is intact.Neurology consulted and reports  At this time, I do not think that there is a neurological process involved. I do wonder if a rheumatological evaluation could be helpful if no clear infectious source given family history.  I suspect that the tingling he experienced was secondary to hyperventilation due to pain. He does not have any objective weakness, and his sensation is intact. At this time, I do not think that there is a neurological process involved. I do wonder if a rheumatological evaluation could be helpful if no clear infectious source given family history.        Prior Functioning  Home Living Family/patient expects to be discharged to:: Private residence Living Arrangements: Parent;Other relatives Available Help at Discharge: Family Type of  Home: House Home Access: Stairs to enter [employer] of Steps: 3 Entrance Stairs-Rails:  (column) Home Layout: Two level Alternate Level Stairs-Number of Steps: flight Alternate Level Stairs-Rails: Right Home Equipment: Walker - 2 wheels Prior Function Level of Independence: Independent Communication Communication: No difficulties    Cognition  Cognition Arousal/Alertness: Awake/alert Behavior During Therapy: WFL for tasks assessed/performed Overall Cognitive Status: Within Functional Limits for tasks assessed    Extremity/Trunk Assessment Upper Extremity Assessment Upper Extremity Assessment: Overall WFL for tasks assessed Lower Extremity Assessment Lower Extremity Assessment: Overall WFL for tasks assessed Cervical / Trunk Assessment Cervical / Trunk Assessment:  (keeps back straight and stiff.)   Balance Balance Balance Assessed: Yes Dynamic Sitting Balance Dynamic Sitting - Balance Support: Bilateral upper extremity supported Dynamic Sitting - Level of Assistance: 6: Modified independent (Device/Increase time)  End of Session PT - End of Session Activity Tolerance: Patient limited by pain Patient left: in chair;with family/visitor present Nurse Communication: Mobility status  GP     Rada Hay 06/10/2013, 10:21 AM  Blanchard Kelch PT [phone]

## 2013-06-10 NOTE — Progress Notes (Signed)
Patient ID: Joshua Guerra, male   DOB: 1991-08-11, 22 y.o.   MRN: [id number] [facility] PROGRESS NOTE  Joshua Guerra GEX:[id number] DOB: 1991/10/27 DOA: 06/08/2013 PCP: No primary provider on file.  Brief narrative: Pt is 22 yo male who presented to [facility] ED with main concern of one week duration of mostly posterior part of the head pain, constant and dull, 7/10 in severity with no specific radiating symptoms, no specific alleviating or aggravating symptoms, associated with neck pain, upper and lower back pain, generalized body numbness. He explains that lower back pain radiates to the upper back and neck area, its sharp and intermittent, 10/10 in severity. He explains that pain is worse today. He denies any similar events in the past, no fevers, chills, no recent sick contacts or exposures, no visual changes, no dizziness, no specific focal neurological symptoms, no abdominal or urinary concerns, no recent travelling, he lives at home with parents.   In ED, pt in persistent pain, upper and lower extremity weakness noted on exam, numbness. [facility] asked to admit for further evaluation and management.   Assessment and Plan:  Back pain with upper and lower extremity weakness and numbness  - unclear etiology at this time, possibly viral but no clear source identifiable at this time  - slight clinical improvement over 24 hour period  - appreciate neurology involvement  - EBV, mono screen, rapid strep test negative to date - small diffuse petechia noted on the lower and upper extremities, RSMF and Lyme dx scan pending Hyponatremia  - unclear etiology and possibly related to viral etiology vs RSMF, Lyme disease  - now within normal limits - continue Doxycycline until final results back Multifocal PNA - noted on CT chest - Levaquin started but pt developed itching and will transition to Zithromax and Rocephin  Thrombocytopenia - unclear etiology and possibly viral mediated as  well - monitor closely, no signs of active bleed, CBC in AM  Consultants:  Neurology  Procedures/Studies:  Ct Head Wo Guerra 06/08/2013 Hypoattenuation within the left pons and temporal tip, likely artifactual but acute/subacute ischemic changes not excluded.  Joshua Guerra 06/08/2013 Essentially negative MRI the brain.  Joshua Guerra 06/08/2013 Negative motion degraded exam as noted above.  Joshua Guerra 06/08/2013 Degenerative changes most prominent T8-9 and T9-10 as detailed above.  Ct Chest Wo Guerra 06/09/2013  Bilateral infiltrates with the appearance of multifocal pneumonia. No pleural effusion.    Ct Abdomen Pelvis W Guerra 06/09/2013  No acute findings or other significant abnormality within the abdomen or pelvis. Antibiotics:  Doxycycline 7/13 -->  Levaquin 7/13 --> 7/14 Zithromax 7/14 --> Rocephin 7/14 -->  Code Status: Full  Family Communication: Pt and family at bedside  Disposition Plan: Home when medically stable  HPI/Subjective: No events overnight. Persistent back pain with spasms and itching.  Objective: Filed Vitals:   06/09/13 2049 06/09/13 2328 06/10/13 0100 06/10/13 0525  BP: 114/50 122/67 123/93 111/59  Pulse: 83 69 93 67  Temp: 99.2 F (37.3 C) 98.3 F (36.8 C) 97.4 F (36.3 C) 97.8 F (36.6 C)  TempSrc: Oral Oral Oral Oral  Resp: 16 18 19 18   Height:      Weight:      SpO2: 97% 98% 100% 95%    Intake/Output Summary (Last 24 hours) at 06/10/13 1324 Last data filed at 06/10/13 1100  Gross per 24 hour  Intake 2473.75 ml  Output  4000 ml  Net -1526.25 ml    Exam:   General:  Pt is alert, follows commands appropriately, not in acute distress, scattered petechia over the entire body   Cardiovascular: Regular rate and rhythm, S1/S2, no murmurs, no rubs, no gallops  Respiratory: Clear to auscultation bilaterally, no wheezing, no crackles, no rhonchi  Abdomen: Soft, non tender, non  distended, bowel sounds present, no guarding  Extremities: No edema, pulses DP and PT palpable bilaterally  Neuro: Grossly nonfocal  Data Reviewed: Basic Metabolic Panel:  Recent Labs Lab 06/08/13 0830 06/08/13 1650 06/09/13 0450 06/10/13 0525  NA 133*  --  131* 138  K 4.0  --  3.6 3.8  CL 100  --  98 103  CO2 21  --  24 28  GLUCOSE 84  --  107* 96  BUN 11  --  8 9  CREATININE 0.90  --  0.87 0.77  CALCIUM 9.7  --  8.9 9.4  MG  --  1.9  --   --   PHOS  --  2.5  --   --    Liver Function Tests:  Recent Labs Lab 06/08/13 0830  AST 26  ALT 15  ALKPHOS 108  BILITOT 0.5  PROT 7.7  ALBUMIN 4.3   CBC:  Recent Labs Lab 06/08/13 0830 06/09/13 0450 06/10/13 0525  WBC 6.2 6.2 6.1  NEUTROABS 4.3  --   --   HGB 16.0 14.3 13.7  HCT 44.8 39.8 38.8*  MCV 82.1 81.9 81.9  PLT 127* 124* 120*   Cardiac Enzymes:  Recent Labs Lab 06/08/13 1340  CKTOTAL 140   Recent Results (from the past 240 hour(s))  CSF CULTURE     Status: None   Collection Time    06/08/13  9:25 AM      Result Value Range Status   Specimen Description CSF   Final   Special Requests NONE   Final   Gram Stain     Final   Value: NO WBC SEEN     NO ORGANISMS SEEN     Gram Stain Report Called to,Read Back By and Verified With: Gram Stain Report Called to,Read Back By and Verified With: N DAVIS RN AT 1040 ON [id number] BY C BONGEL Performed at [facility]   Culture NO GROWTH 2 DAYS   Final   Report Status PENDING   Incomplete  GRAM STAIN     Status: None   Collection Time    06/08/13  9:25 AM      Result Value Range Status   Specimen Description CSF   Final   Special Requests NONE   Final   Gram Stain     Final   Value: NO WBC SEEN     NO ORGANISMS SEEN     Gram Stain Report Called to,Read Back By and Verified With: N.DAVIS RN AT 1040 ON [id number] BY C.BONGEL   Report Status 06/08/2013 FINAL   Final  CULTURE, BLOOD (ROUTINE X 2)     Status: None   Collection Time    06/09/13  6:43 AM       Result Value Range Status   Specimen Description BLOOD RIGHT ARM   Final   Special Requests BOTTLES DRAWN AEROBIC AND ANAEROBIC [facility]   Final   Culture  Setup Time 06/09/2013 15:15   Final   Culture     Final   Value:        BLOOD CULTURE RECEIVED NO GROWTH TO  DATE CULTURE WILL BE HELD FOR 5 DAYS BEFORE ISSUING A FINAL NEGATIVE REPORT   Report Status PENDING   Incomplete  CULTURE, BLOOD (ROUTINE X 2)     Status: None   Collection Time    06/09/13  6:48 AM      Result Value Range Status   Specimen Description BLOOD RIGHT HAND   Final   Special Requests BOTTLES DRAWN AEROBIC AND ANAEROBIC 6CC   Final   Culture  Setup Time 06/09/2013 15:14   Final   Culture     Final   Value:        BLOOD CULTURE RECEIVED NO GROWTH TO DATE CULTURE WILL BE HELD FOR 5 DAYS BEFORE ISSUING A FINAL NEGATIVE REPORT   Report Status PENDING   Incomplete  RAPID STREP SCREEN     Status: None   Collection Time    06/09/13  9:34 AM      Result Value Range Status   Streptococcus, Group A Screen (Direct) NEGATIVE  NEGATIVE Final   Comment: (NOTE)     A Rapid Antigen test may result negative if the antigen level in the     sample is below the detection level of this test. The FDA has not     cleared this test as a stand-alone test therefore the rapid antigen     negative result has reflexed to a Group A Strep culture.     Scheduled Meds: . doxycycline  100 mg Oral Q12H  . enoxaparin (LOVENOX) injection  40 mg Subcutaneous Q24H  . levofloxacin (LEVAQUIN) IV  500 mg Intravenous Q24H   Continuous Infusions: . sodium chloride 75 mL/hr at 06/09/13 1201     Debbora Presto, MD  [facility] Pager [phone]  If 7PM-7AM, please contact night-coverage [email or web address] Password [email or web address] 06/10/2013, 1:24 PM   LOS: 2 days

## 2013-06-11 LAB — CBC
Hemoglobin: 13.4 g/dL (ref 13.0–17.0)
MCH: 29.1 pg (ref 26.0–34.0)
MCHC: 35 g/dL (ref 30.0–36.0)
MCV: 83.1 fL (ref 78.0–100.0)
RBC: 4.61 MIL/uL (ref 4.22–5.81)

## 2013-06-11 LAB — CSF CULTURE W GRAM STAIN
Culture: NO GROWTH
Gram Stain: NONE SEEN

## 2013-06-11 LAB — CULTURE, GROUP A STREP

## 2013-06-11 LAB — BASIC METABOLIC PANEL
CO2: 29 mEq/L (ref 19–32)
Calcium: 9.3 mg/dL (ref 8.4–10.5)
Creatinine, Ser: 0.9 mg/dL (ref 0.50–1.35)
GFR calc non Af Amer: >90 mL/min (ref >90)
Glucose, Bld: 102 mg/dL — ABNORMAL HIGH (ref 70–99)
Sodium: 135 mEq/L (ref 135–145)

## 2013-06-11 LAB — ROCKY MTN SPOTTED FVR AB, IGM-BLOOD: RMSF IgM: 0.2 IV (ref 0.00–0.89)

## 2013-06-11 LAB — EPSTEIN-BARR VIRUS VCA, IGM: EBV VCA IgM: <10 U/mL (ref <36.0)

## 2013-06-11 MED ORDER — MAGNESIUM CITRATE PO SOLN
1.0000 | Freq: Once | ORAL | Status: AC
  Administered 2013-06-11: 1 via ORAL

## 2013-06-11 MED ORDER — IBUPROFEN 200 MG PO TABS
200.0000 mg | ORAL_TABLET | Freq: Four times a day (QID) | ORAL | Status: DC | PRN
  Administered 2013-06-11: 200 mg via ORAL
  Filled 2013-06-11 (×2): qty 1

## 2013-06-11 MED ORDER — PNEUMOCOCCAL VAC POLYVALENT 25 MCG/0.5ML IJ INJ
0.5000 mL | INJECTION | Freq: Once | INTRAMUSCULAR | Status: AC
  Administered 2013-06-11: 0.5 mL via INTRAMUSCULAR
  Filled 2013-06-11 (×2): qty 0.5

## 2013-06-11 MED ORDER — FENTANYL 50 MCG/HR TD PT72
50.0000 ug | MEDICATED_PATCH | TRANSDERMAL | Status: DC
  Administered 2013-06-11: 50 ug via TRANSDERMAL
  Filled 2013-06-11: qty 1

## 2013-06-11 NOTE — Progress Notes (Signed)
Patient ID: Joshua Guerra, male   DOB: 08/27/1991, 22 y.o.   MRN: [id number] [facility] PROGRESS NOTE  GERGORY BIELLO III JXB:[id number] DOB: 16-Nov-1991 DOA: 06/08/2013 PCP: No primary provider on file.  Brief narrative:  Pt is 22 yo male who presented to [facility] ED with main concern of one week duration of mostly posterior part of the head pain, constant and dull, 7/10 in severity with no specific radiating symptoms, no specific alleviating or aggravating symptoms, associated with neck pain, upper and lower back pain, generalized body numbness. He explains that lower back pain radiates to the upper back and neck area, its sharp and intermittent, 10/10 in severity. He explains that pain is worse today. He denies any similar events in the past, no fevers, chills, no recent sick contacts or exposures, no visual changes, no dizziness, no specific focal neurological symptoms, no abdominal or urinary concerns, no recent travelling, he lives at home with parents.   In ED, pt in persistent pain, upper and lower extremity weakness noted on exam, numbness. [facility] asked to admit for further evaluation and management.   Assessment and Plan:  Back pain with upper and lower extremity weakness and numbness  - unclear etiology at this time, possibly viral but no clear source identifiable at this time  - pt clinically improving and reports less spasms, pain better controlled  - appreciate neurology involvement  - EBV, mono screen, rapid strep test negative to date, Lyme disease test negative as well, RMSF panel pending   - small diffuse petechia noted on the lower and upper extremities, now improving as well  Hyponatremia  - unclear etiology and possibly related to viral etiology - now within normal limits  - pt tolerating current diet well, will plan on discontinuing fluid  Multifocal PNA  - noted on CT chest  - Levaquin started but pt developed itching - continue Zithromax and Rocephin day  #2/7 Thrombocytopenia  - unclear etiology and possibly viral mediated as well  - monitor closely, no signs of active bleed, CBC in AM   Consultants:  Neurology - signed off 7/14  Procedures/Studies:  Ct Head Wo Contrast 06/08/2013 Hypoattenuation within the left pons and temporal tip, likely artifactual but acute/subacute ischemic changes not excluded.  [name] 06/08/2013 Essentially negative MRI the brain.  [name] 06/08/2013 Negative motion degraded exam as noted above.  [name] 06/08/2013 Degenerative changes most prominent T8-9 and T9-10 as detailed above.  Ct Chest Wo Contrast 06/09/2013 Bilateral infiltrates with the appearance of multifocal pneumonia. No pleural effusion.  Ct Abdomen Pelvis W Contrast 06/09/2013 No acute findings or other significant abnormality within the abdomen or pelvis. Antibiotics:  Doxycycline 7/13 --> 7/15 Levaquin 7/13 --> 7/14  Zithromax 7/14 -->  Rocephin 7/14 -->  Code Status: Full  Family Communication: Pt and family at bedside  Disposition Plan: Home when medically stable, likely in AM   HPI/Subjective: No events overnight.   Objective: Filed Vitals:   06/10/13 1500 06/10/13 2212 06/11/13 0640 06/11/13 1350  BP: 120/66 114/65 113/51 118/54  Pulse: 70 74 63 66  Temp: 98 F (36.7 C) 99.3 F (37.4 C) 98.5 F (36.9 C) 98.1 F (36.7 C)  TempSrc: Oral Oral Oral Oral  Resp: 18 18 18 16   Height:      Weight:      SpO2: 95% 94% 98% 100%    Intake/Output Summary (Last 24 hours) at  06/11/13 1805 Last data filed at 06/11/13 1447  Gross per 24 hour  Intake 3261.25 ml  Output   3250 ml  Net  11.25 ml    Exam:   General:  Pt is alert, follows commands appropriately, not in acute distress  Cardiovascular: Regular rate and rhythm, S1/S2, no murmurs, no rubs, no gallops  Respiratory: Clear to auscultation bilaterally, no wheezing, no crackles, no rhonchi  Abdomen: Soft,  non tender, non distended, bowel sounds present, no guarding  Extremities: No edema, pulses DP and PT palpable bilaterally  Neuro: Grossly nonfocal  Data Reviewed: Basic Metabolic Panel:  Recent Labs Lab 06/08/13 0830 06/08/13 1650 06/09/13 0450 06/10/13 0525 06/11/13 0408  NA 133*  --  131* 138 135  K 4.0  --  3.6 3.8 4.1  CL 100  --  98 103 99  CO2 21  --  24 28 29   GLUCOSE 84  --  107* 96 102*  BUN 11  --  8 9 13   CREATININE 0.90  --  0.87 0.77 0.90  CALCIUM 9.7  --  8.9 9.4 9.3  MG  --  1.9  --   --   --   PHOS  --  2.5  --   --   --    Liver Function Tests:  Recent Labs Lab 06/08/13 0830  AST 26  ALT 15  ALKPHOS 108  BILITOT 0.5  PROT 7.7  ALBUMIN 4.3   CBC:  Recent Labs Lab 06/08/13 0830 06/09/13 0450 06/10/13 0525 06/11/13 0408  WBC 6.2 6.2 6.1 5.2  NEUTROABS 4.3  --   --   --   HGB 16.0 14.3 13.7 13.4  HCT 44.8 39.8 38.8* 38.3*  MCV 82.1 81.9 81.9 83.1  PLT 127* 124* 120* 137*   Cardiac Enzymes:  Recent Labs Lab 06/08/13 1340  CKTOTAL 140   Recent Results (from the past 240 hour(s))  CSF CULTURE     Status: None   Collection Time    06/08/13  9:25 AM      Result Value Range Status   Specimen Description CSF   Final   Special Requests NONE   Final   Gram Stain     Final   Value: NO WBC SEEN     NO ORGANISMS SEEN     Gram Stain Report Called to,Read Back By and Verified With: Gram Stain Report Called to,Read Back By and Verified With: N DAVIS RN AT 1040 ON [id number] BY C BONGEL Performed at [facility]   Culture NO GROWTH 3 DAYS   Final   Report Status 06/11/2013 FINAL   Final  GRAM STAIN     Status: None   Collection Time    06/08/13  9:25 AM      Result Value Range Status   Specimen Description CSF   Final   Special Requests NONE   Final   Gram Stain     Final   Value: NO WBC SEEN     NO ORGANISMS SEEN     Gram Stain Report Called to,Read Back By and Verified With: N.DAVIS RN AT 1040 ON [id number] BY C.BONGEL   Report  Status 06/08/2013 FINAL   Final  CULTURE, BLOOD (ROUTINE X 2)     Status: None   Collection Time    06/09/13  6:43 AM      Result Value Range Status   Specimen Description BLOOD RIGHT ARM   Final   Special Requests BOTTLES DRAWN  AEROBIC AND ANAEROBIC 6CC   Final   Culture  Setup Time 06/09/2013 15:15   Final   Culture     Final   Value:        BLOOD CULTURE RECEIVED NO GROWTH TO DATE CULTURE WILL BE HELD FOR 5 DAYS BEFORE ISSUING A FINAL NEGATIVE REPORT   Report Status PENDING   Incomplete  CULTURE, BLOOD (ROUTINE X 2)     Status: None   Collection Time    06/09/13  6:48 AM      Result Value Range Status   Specimen Description BLOOD RIGHT HAND   Final   Special Requests BOTTLES DRAWN AEROBIC AND ANAEROBIC 6CC   Final   Culture  Setup Time 06/09/2013 15:14   Final   Culture     Final   Value:        BLOOD CULTURE RECEIVED NO GROWTH TO DATE CULTURE WILL BE HELD FOR 5 DAYS BEFORE ISSUING A FINAL NEGATIVE REPORT   Report Status PENDING   Incomplete  RAPID STREP SCREEN     Status: None   Collection Time    06/09/13  9:34 AM      Result Value Range Status   Streptococcus, Group A Screen (Direct) NEGATIVE  NEGATIVE Final   Comment: (NOTE)     A Rapid Antigen test may result negative if the antigen level in the     sample is below the detection level of this test. The FDA has not     cleared this test as a stand-alone test therefore the rapid antigen     negative result has reflexed to a Group A Strep culture.  CULTURE, GROUP A STREP     Status: None   Collection Time    06/09/13  9:34 AM      Result Value Range Status   Specimen Description THROAT   Final   Special Requests NONE   Final   Culture No Beta Hemolytic Streptococci Isolated   Final   Report Status 06/11/2013 FINAL   Final     Scheduled Meds: . azithromycin  500 mg Intravenous Q24H  . cefTRIAXone IV  1 g Intravenous Q24H  . doxycycline  100 mg Oral Q12H  . enoxaparin injection  40 mg Subcutaneous Q24H  . magnesium  citrate  1 Bottle Oral Once   Continuous Infusions: . sodium chloride 75 mL/hr at 06/10/13 1823     Debbora Presto, MD  [facility] Pager [phone]  If 7PM-7AM, please contact night-coverage [email or web address] Password [email or web address] 06/11/2013, 6:05 PM   LOS: 3 days

## 2013-06-11 NOTE — Progress Notes (Signed)
Physical Therapy Treatment Patient Details Name: Joshua Guerra MRN: [id number] DOB: [birth year] Today's Date: 06/11/2013 Time: 1010-1034 PT Time Calculation (min): 24 min  PT Assessment / Plan / Recommendation  PT Comments   Pt just finished taking a shower "by himself" reports his mother.  Assisted pt OOB to amb in hallway.  Amb with then without RW.  Pt preferred using the RW as he felt more steady.  Pt did have one back spasm during session in which activity was ceased and pt sat down in a flexed position.  Allowed 2 min for spasm to subside and cont walking back to his room.  Instructed pt on positioning of flex his and knees with HOB elevated to help relieve back tension.   Follow Up Recommendations        Does the patient have the potential to tolerate intense rehabilitation     Barriers to Discharge        Equipment Recommendations       Recommendations for Other Services    Frequency Min 3X/week   Progress towards PT Goals Progress towards PT goals: Progressing toward goals  Plan      Precautions / Restrictions Precautions Precaution Comments: spasms of back, Restrictions Weight Bearing Restrictions: No    Pertinent Vitals/Pain Pt did have one "back spasm" during gait which he describes as a thousand bugs traveling from his low back all the way up to his neck and shoulders.  "Gripping"    Mobility  Bed Mobility Bed Mobility: Supine to Sit;Sit to Supine Supine to Sit: 6: Modified independent (Device/Increase time) Sit to Supine: 6: Modified independent (Device/Increase time) Details for Bed Mobility Assistance: increased time 2nd c/o back spasms that trigger with no know cause Transfers Transfers: Sit to Stand;Stand to Sit Sit to Stand: 6: Modified independent (Device/Increase time);From bed Stand to Sit: 6: Modified independent (Device/Increase time);To bed Details for Transfer Assistance: increased time Ambulation/Gait Ambulation/Gait Assistance: 5:  Supervision Ambulation Distance (Feet): 200 Feet Assistive device: Rolling walker;None Ambulation/Gait Assistance Details: amb first 100 feet with RW which pt prefered for increased staedyness.  Amb w/o AD 25' pt noted to have increased BOS and tension throughout as he fear a back spasm trigger.  Gait Pattern: Step-through pattern;Decreased trunk rotation;Decreased stride length;Decreased hip/knee flexion - right;Decreased hip/knee flexion - left Gait velocity: decreased     PT Goals (current goals can now be found in the care plan section)    Visit Information  Last PT Received On: 06/11/13 Assistance Needed: +1    Subjective Data      Cognition       Balance     End of Session PT - End of Session Equipment Utilized During Treatment: Gait belt Activity Tolerance: Other (comment) (back spasms) Patient left: in bed;with call bell/phone within reach;with nursing/sitter in room Nurse Communication: Mobility status   Felecia Shelling  PTA [facility]  Acute  Rehab Pager      [phone]

## 2013-06-12 LAB — CBC
MCH: 29.3 pg (ref 26.0–34.0)
MCHC: 35.8 g/dL (ref 30.0–36.0)
MCV: 82 fL (ref 78.0–100.0)
Platelets: 184 10*3/uL (ref 150–400)
RBC: 4.71 MIL/uL (ref 4.22–5.81)
RDW: 12.6 % (ref 11.5–15.5)

## 2013-06-12 LAB — LEGIONELLA ANTIGEN, URINE: Legionella Antigen, Urine: NEGATIVE

## 2013-06-12 LAB — BASIC METABOLIC PANEL
CO2: 27 mEq/L (ref 19–32)
Calcium: 9.6 mg/dL (ref 8.4–10.5)
Creatinine, Ser: 0.76 mg/dL (ref 0.50–1.35)
GFR calc Af Amer: >90 mL/min (ref >90)
GFR calc non Af Amer: >90 mL/min (ref >90)
Sodium: 139 mEq/L (ref 135–145)

## 2013-06-12 MED ORDER — BISACODYL 5 MG PO TBEC
10.0000 mg | DELAYED_RELEASE_TABLET | Freq: Every day | ORAL | Status: DC | PRN
  Administered 2013-06-12: 10 mg via ORAL
  Filled 2013-06-12: qty 2

## 2013-06-12 MED ORDER — HYDROXYZINE HCL 25 MG PO TABS: 25.0000 mg | ORAL_TABLET | ORAL | Status: DC | PRN

## 2013-06-12 MED ORDER — ZOLPIDEM TARTRATE 10 MG PO TABS: 10.0000 mg | ORAL_TABLET | Freq: Every evening | ORAL | Status: DC | PRN

## 2013-06-12 MED ORDER — AZITHROMYCIN 250 MG PO TABS: 250.0000 mg | ORAL_TABLET | Freq: Every day | ORAL | Status: DC

## 2013-06-12 MED ORDER — POLYETHYLENE GLYCOL 3350 17 G PO PACK
17.0000 g | PACK | Freq: Every day | ORAL | Status: DC | PRN
  Administered 2013-06-12: 17 g via ORAL
  Filled 2013-06-12: qty 1

## 2013-06-12 MED ORDER — BISACODYL 5 MG PO TBEC: 10.0000 mg | DELAYED_RELEASE_TABLET | Freq: Every day | ORAL | Status: DC | PRN

## 2013-06-12 MED ORDER — OXYCODONE-ACETAMINOPHEN 5-325 MG PO TABS: 1.0000 | ORAL_TABLET | Freq: Four times a day (QID) | ORAL | Status: DC | PRN

## 2013-06-12 NOTE — Progress Notes (Signed)
Patient compains of difficulty with urination. Bladder scan performed. 98 cc of urine in bladder. Will continue to monitor.

## 2013-06-12 NOTE — Care Management Note (Signed)
[name] spoke with patient at bedside concerning discharge follow up. Pt current health provider is [name]. Per pt would like to follow up with [name] or [name] at [facility]. Appointment scheduled for 06/26/13 at 3:00pm with [name]. PT eval recommends HHPT. Per pt choice [facility] to provide [facility] services. [facility] liason [facility] notified of referral.    Joshua Guerra [phone]

## 2013-06-12 NOTE — Discharge Summary (Signed)
Physician Discharge Summary  Joshua Guerra ZOX:[id number] DOB: 09-Jul-1991 DOA: 06/08/2013  PCP: No primary provider on file.  Admit date: 06/08/2013 Discharge date: 06/12/2013  Recommendations for Outpatient Follow-up:  1. Pt will need to follow up with PCP in 2-3 weeks post discharge 2. Please obtain BMP to evaluate electrolytes and kidney function 3. Please also check CBC to evaluate Hg and Hct levels 4. Please note that pt was discharge don Zithromax to complete the therapy for PNA, 7 more days post discharge  5. He was also advised to come to our clinic as noted below as soon as possible for follow up  Discharge Diagnoses:  Community acquired PNA   Discharge Condition: Stable  Diet recommendation: Heart healthy diet discussed in details   Brief narrative:  Pt is 22 yo male who presented to [facility] ED with main concern of one week duration of mostly posterior part of the head pain, constant and dull, 7/10 in severity with no specific radiating symptoms, no specific alleviating or aggravating symptoms, associated with neck pain, upper and lower back pain, generalized body numbness. He explains that lower back pain radiates to the upper back and neck area, its sharp and intermittent, 10/10 in severity. He explains that pain is worse today. He denies any similar events in the past, no fevers, chills, no recent sick contacts or exposures, no visual changes, no dizziness, no specific focal neurological symptoms, no abdominal or urinary concerns, no recent travelling, he lives at home with parents.   In ED, pt in persistent pain, upper and lower extremity weakness noted on exam, numbness. [facility] asked to admit for further evaluation and management.   Assessment and Plan:  Back pain with upper and lower extremity weakness and numbness  - unclear etiology at this time, possibly viral but no clear source identifiable at this time, possibly related to PNA  - pt clinically improving and  reports less spasms, pain better controlled  - appreciate neurology involvement  - EBV, mono screen, rapid strep test negative to date, Lyme disease test negative as well, RMSF negative - small diffuse petechia noted on the lower and upper extremities, now improving as well  Hyponatremia  - unclear etiology and possibly related to viral etiology  - now within normal limits  - pt tolerating current diet well, will plan on discontinuing fluid  Multifocal PNA  - noted on CT chest  - Levaquin started but pt developed itching  - continue Zithromax and Rocephin day #3/10 - pt will be discharge on Zithromax to complete his therapy  Thrombocytopenia  - unclear etiology and possibly viral mediated as well  - monitor closely, no signs of active bleed  Consultants:  Neurology - signed off 7/14  Procedures/Studies:  Ct Head Wo Contrast 06/08/2013 Hypoattenuation within the left pons and temporal tip, likely artifactual but acute/subacute ischemic changes not excluded.  [name] 06/08/2013 Essentially negative MRI the brain.  [name] 06/08/2013 Negative motion degraded exam as noted above.  [name] 06/08/2013 Degenerative changes most prominent T8-9 and T9-10 as detailed above.  Ct Chest Wo Contrast 06/09/2013 Bilateral infiltrates with the appearance of multifocal pneumonia. No pleural effusion.  Ct Abdomen Pelvis W Contrast 06/09/2013 No acute findings or other significant abnormality within the abdomen or pelvis. Antibiotics:  Doxycycline 7/13 --> 7/15  Levaquin 7/13 --> 7/14  Zithromax 7/14 --> 7 more days post discharge  Rocephin 7/14 --> 7/16  Code Status: Full  Family Communication: Pt and family at bedside   Discharge Exam: Filed Vitals:   06/12/13 1500  BP: 119/64  Pulse: 60  Temp: 97.7 F (36.5 C)  Resp: 18   Filed Vitals:   06/11/13 1350 06/11/13 2057 06/12/13 0500 06/12/13 1500  BP: 118/54 120/52 112/70  119/64  Pulse: 66 60 64 60  Temp: 98.1 F (36.7 C) 98.1 F (36.7 C) 97.8 F (36.6 C) 97.7 F (36.5 C)  TempSrc: Oral Oral Oral Oral  Resp: 16 18 16 18   Height:      Weight:      SpO2: 100% 100% 99% 100%    General: Pt is alert, follows commands appropriately, not in acute distress Cardiovascular: Regular rate and rhythm, S1/S2 +, no murmurs, no rubs, no gallops Respiratory: Clear to auscultation bilaterally, no wheezing, no crackles, no rhonchi Abdominal: Soft, non tender, non distended, bowel sounds +, no guarding Extremities: no edema, no cyanosis, pulses palpable bilaterally DP and PT Neuro: Grossly nonfocal  Discharge Instructions  Discharge Orders   Future Appointments Provider Department Dept Phone   06/26/2013 3:00 PM [facility] Covering Provider [facility] Joan Flores [phone]   Future Orders Complete By Expires     Diet - low sodium heart healthy  As directed     Increase activity slowly  As directed         Medication List    STOP taking these medications       PRESCRIPTION MEDICATION      TAKE these medications       azithromycin 250 MG tablet  Commonly known as:  ZITHROMAX  Take 1 tablet (250 mg total) by mouth daily.     bisacodyl 5 MG EC tablet  Commonly known as:  DULCOLAX  Take 2 tablets (10 mg total) by mouth daily as needed.     diphenhydrAMINE 25 mg capsule  Commonly known as:  BENADRYL  Take 25 mg by mouth every 6 (six) hours as needed for itching.     hydrOXYzine 25 MG tablet  Commonly known as:  ATARAX/VISTARIL  Take 1 tablet (25 mg total) by mouth every 4 (four) hours as needed for itching.     ibuprofen 200 MG tablet  Commonly known as:  ADVIL,MOTRIN  Take 400 mg by mouth every 6 (six) hours as needed for pain.     oxyCODONE-acetaminophen 5-325 MG per tablet  Commonly known as:  PERCOCET/ROXICET  Take 1-2 tablets by mouth every 6 (six) hours as needed.     zolpidem 10 MG tablet  Commonly known as:  AMBIEN   Take 1 tablet (10 mg total) by mouth at bedtime as needed for sleep.           Follow-up Information   Follow up with Debbora Presto, MD On 06/26/2013. (at 3:00 pm )    Contact information:   [address] [place] [address] [phone] (clinic) cell phone number [phone]        The results of significant diagnostics from this hospitalization (including imaging, microbiology, ancillary and laboratory) are listed below for reference.     Microbiology: Recent Results (from the past 240 hour(s))  CSF CULTURE     Status: None   Collection Time    06/08/13  9:25 AM      Result Value Range Status   Specimen Description CSF   Final   Special Requests NONE   Final   Gram Stain  Final   Value: NO WBC SEEN     NO ORGANISMS SEEN     Gram Stain Report Called to,Read Back By and Verified With: Gram Stain Report Called to,Read Back By and Verified With: N DAVIS RN AT 1040 ON [id number] BY C BONGEL Performed at [facility]   Culture NO GROWTH 3 DAYS   Final   Report Status 06/11/2013 FINAL   Final  GRAM STAIN     Status: None   Collection Time    06/08/13  9:25 AM      Result Value Range Status   Specimen Description CSF   Final   Special Requests NONE   Final   Gram Stain     Final   Value: NO WBC SEEN     NO ORGANISMS SEEN     Gram Stain Report Called to,Read Back By and Verified With: N.DAVIS RN AT 1040 ON [id number] BY C.BONGEL   Report Status 06/08/2013 FINAL   Final  CULTURE, BLOOD (ROUTINE X 2)     Status: None   Collection Time    06/09/13  6:43 AM      Result Value Range Status   Specimen Description BLOOD RIGHT ARM   Final   Special Requests BOTTLES DRAWN AEROBIC AND ANAEROBIC 6CC   Final   Culture  Setup Time 06/09/2013 15:15   Final   Culture     Final   Value:        BLOOD CULTURE RECEIVED NO GROWTH TO DATE CULTURE WILL BE HELD FOR 5 DAYS BEFORE ISSUING A FINAL NEGATIVE REPORT   Report Status PENDING   Incomplete  CULTURE, BLOOD (ROUTINE X  2)     Status: None   Collection Time    06/09/13  6:48 AM      Result Value Range Status   Specimen Description BLOOD RIGHT HAND   Final   Special Requests BOTTLES DRAWN AEROBIC AND ANAEROBIC 6CC   Final   Culture  Setup Time 06/09/2013 15:14   Final   Culture     Final   Value:        BLOOD CULTURE RECEIVED NO GROWTH TO DATE CULTURE WILL BE HELD FOR 5 DAYS BEFORE ISSUING A FINAL NEGATIVE REPORT   Report Status PENDING   Incomplete  RAPID STREP SCREEN     Status: None   Collection Time    06/09/13  9:34 AM      Result Value Range Status   Streptococcus, Group A Screen (Direct) NEGATIVE  NEGATIVE Final   Comment: (NOTE)     A Rapid Antigen test may result negative if the antigen level in the     sample is below the detection level of this test. The FDA has not     cleared this test as a stand-alone test therefore the rapid antigen     negative result has reflexed to a Group A Strep culture.  CULTURE, GROUP A STREP     Status: None   Collection Time    06/09/13  9:34 AM      Result Value Range Status   Specimen Description THROAT   Final   Special Requests NONE   Final   Culture No Beta Hemolytic Streptococci Isolated   Final   Report Status 06/11/2013 FINAL   Final     Labs: Basic Metabolic Panel:  Recent Labs Lab 06/08/13 0830 06/08/13 1650 06/09/13 0450 06/10/13 0525 06/11/13 0408 06/12/13 0430  NA 133*  --  131* 138 135 139  K 4.0  --  3.6 3.8 4.1 4.3  CL 100  --  98 103 99 102  CO2 21  --  24 28 29 27   GLUCOSE 84  --  107* 96 102* 103*  BUN 11  --  8 9 13 11   CREATININE 0.90  --  0.87 0.77 0.90 0.76  CALCIUM 9.7  --  8.9 9.4 9.3 9.6  MG  --  1.9  --   --   --   --   PHOS  --  2.5  --   --   --   --    Liver Function Tests:  Recent Labs Lab 06/08/13 0830  AST 26  ALT 15  ALKPHOS 108  BILITOT 0.5  PROT 7.7  ALBUMIN 4.3   CBC:  Recent Labs Lab 06/08/13 0830 06/09/13 0450 06/10/13 0525 06/11/13 0408 06/12/13 0430  WBC 6.2 6.2 6.1 5.2 6.0   NEUTROABS 4.3  --   --   --   --   HGB 16.0 14.3 13.7 13.4 13.8  HCT 44.8 39.8 38.8* 38.3* 38.6*  MCV 82.1 81.9 81.9 83.1 82.0  PLT 127* 124* 120* 137* 184   Cardiac Enzymes:  Recent Labs Lab 06/08/13 1340  CKTOTAL 140   SIGNED: Time coordinating discharge: Over 30 minutes  Debbora Presto, MD  [facility] 06/12/2013, 3:01 PM Pager [phone]  If 7PM-7AM, please contact night-coverage [email or web address] Password [email or web address]

## 2013-06-12 NOTE — Progress Notes (Signed)
Patient discharged home, all discharge medications and instructions reviewed and questions answered.  Patient states will ambulate to vehicle.

## 2013-06-15 LAB — CULTURE, BLOOD (ROUTINE X 2)

## 2013-06-17 LAB — VARICELLA-ZOSTER BY PCR

## 2013-06-26 ENCOUNTER — Ambulatory Visit: Payer: Managed Care, Other (non HMO) | Attending: Family Medicine | Admitting: Family Medicine

## 2013-06-26 VITALS — BP 124/78 | HR 95 | Temp 98.1°F | Resp 18 | Wt 155.4 lb

## 2013-06-26 DIAGNOSIS — L299 Pruritus, unspecified: Secondary | ICD-10-CM | POA: Insufficient documentation

## 2013-06-26 DIAGNOSIS — R21 Rash and other nonspecific skin eruption: Secondary | ICD-10-CM

## 2013-06-26 DIAGNOSIS — D649 Anemia, unspecified: Secondary | ICD-10-CM

## 2013-06-26 DIAGNOSIS — E871 Hypo-osmolality and hyponatremia: Secondary | ICD-10-CM

## 2013-06-26 LAB — CBC WITH DIFFERENTIAL/PLATELET
Basophils Absolute: 0 10*3/uL (ref 0.0–0.1)
Eosinophils Relative: 1 % (ref 0–5)
Lymphocytes Relative: 22 % (ref 12–46)
Lymphs Abs: 1.4 10*3/uL (ref 0.7–4.0)
MCV: 85.1 fL (ref 78.0–100.0)
Neutrophils Relative %: 70 % (ref 43–77)
Platelets: 268 10*3/uL (ref 150–400)
RBC: 5.1 MIL/uL (ref 4.22–5.81)
RDW: 13.6 % (ref 11.5–15.5)
WBC: 6.5 10*3/uL (ref 4.0–10.5)

## 2013-06-26 MED ORDER — CAMPHOR-MENTHOL 0.5-0.5 % EX LOTN: TOPICAL_LOTION | CUTANEOUS | Status: DC | PRN

## 2013-06-26 NOTE — Progress Notes (Signed)
Patient ID: Joshua Guerra, male   DOB: [birth year], 22 y.o.   MRN: [id number]  CC:  Hospital follow up   HPI: He is feeling better from pneumonia but still having itching all over body and fine macular rash.  He is not taking any meds anymore except benadryl for itching.  He says that cremes are not helping and he is itching all the time.   No Known Allergies History reviewed. No pertinent past medical history. Current Outpatient Prescriptions on File Prior to Visit  Medication Sig Dispense Refill  . bisacodyl (DULCOLAX) 5 MG EC tablet Take 2 tablets (10 mg total) by mouth daily as needed.  30 tablet  0  . hydrOXYzine (ATARAX/VISTARIL) 25 MG tablet Take 1 tablet (25 mg total) by mouth every 4 (four) hours as needed for itching.  30 tablet  0  . ibuprofen (ADVIL,MOTRIN) 200 MG tablet Take 400 mg by mouth every 6 (six) hours as needed for pain.      Marland Kitchen zolpidem (AMBIEN) 10 MG tablet Take 1 tablet (10 mg total) by mouth at bedtime as needed for sleep.  30 tablet  0   No current facility-administered medications on file prior to visit.   History reviewed. No pertinent family history. History   Social History  . Marital Status: Single    Spouse Name: N/A    Number of Children: N/A  . Years of Education: N/A   Occupational History  . Not on file.   Social History Main Topics  . Smoking status: Current Every Day Smoker -- 0.50 packs/day for 5 years    Types: Cigarettes  . Smokeless tobacco: Not on file     Comment: smokes about a 1/2 pack a day  . Alcohol Use: No  . Drug Use: No  . Sexually Active: Not on file   Other Topics Concern  . Not on file   Social History Narrative  . No narrative on file    Review of Systems  Constitutional: Negative for fever, chills, diaphoresis, activity change, appetite change and fatigue.  HENT: Negative for ear pain, nosebleeds, congestion, facial swelling, rhinorrhea, neck pain, neck stiffness and ear discharge.   Eyes: Negative for  pain, discharge, redness, itching and visual disturbance.  Respiratory: Negative for cough, choking, chest tightness, shortness of breath, wheezing and stridor.   Cardiovascular: Negative for chest pain, palpitations and leg swelling.  Gastrointestinal: Negative for abdominal distention.  Genitourinary: Negative for dysuria, urgency, frequency, hematuria, flank pain, decreased urine volume, difficulty urinating and dyspareunia.  Musculoskeletal: Negative for back pain, joint swelling, arthralgias and gait problem.  Neurological: Negative for dizziness, tremors, seizures, syncope, facial asymmetry, speech difficulty, weakness, light-headedness, numbness and headaches.  Hematological: Negative for adenopathy. Does not bruise/bleed easily.  Psychiatric/Behavioral: Negative for hallucinations, behavioral problems, confusion, dysphoric mood, decreased concentration and agitation.    Objective:   Filed Vitals:   06/26/13 1512  BP: 124/78  Pulse: 95  Temp: 98.1 F (36.7 C)  Resp: 18    Physical Exam  Constitutional: Appears well-developed and well-nourished. No distress.  HENT: Normocephalic. External right and left ear normal. Oropharynx is clear and moist.  Eyes: Conjunctivae and EOM are normal. PERRLA, no scleral icterus.  Neck: Normal ROM. Neck supple. No JVD. No tracheal deviation. No thyromegaly.  CVS: RRR, S1/S2 +, no murmurs, no gallops, no carotid bruit.  Pulmonary: Effort and breath sounds normal, no stridor, rhonchi, wheezes, rales.  Abdominal: Soft. BS +,  no distension, tenderness, rebound or guarding.  Musculoskeletal: Normal range of motion. No edema and no tenderness.  Lymphadenopathy: No lymphadenopathy noted, cervical, inguinal. Neuro: Alert. Normal reflexes, muscle tone coordination. No cranial nerve deficit. Skin: Skin is warm and dry. Fine macular rash noted and excoriations on legs from scratching.  Psychiatric: Normal mood and affect. Behavior, judgment, thought  content normal.   Lab Results  Component Value Date   WBC 6.0 06/12/2013   HGB 13.8 06/12/2013   HCT 38.6* 06/12/2013   MCV 82.0 06/12/2013   PLT 184 06/12/2013   Lab Results  Component Value Date   CREATININE 0.76 06/12/2013   BUN 11 06/12/2013   NA 139 06/12/2013   K 4.3 06/12/2013   CL 102 06/12/2013   CO2 27 06/12/2013   No results found for this basename: HGBA1C   Lipid Panel  No results found for this basename: chol, trig, hdl, cholhdl, vldl, ldlcalc     Assessment and plan:   Patient Active Problem List   Diagnosis Date Noted  . Rash and nonspecific skin eruption 06/26/2013  . Pruritus 06/26/2013  . Hyponatremia 06/26/2013  . Anemia 06/26/2013   Severe pruritus skin - Pt is being referred to dermatology because it has persisted for over 1 month and has not responded to other treatment approaches and still persists..he was advised to please change detergents - Sarna lotion prescribed  Check labs today  Follow lab results  Finished antibiotics  The patient was counseled on the dangers of tobacco use, and was advised to quit.  Reviewed strategies to maximize success, including removing cigarettes and smoking materials from environment and stress management.   RTC prn   The patient was given clear instructions to go to ER or return to medical center if symptoms don't improve, worsen or new problems develop.  The patient verbalized understanding.  The patient was told to call to get any lab results if not heard anything in the next week.    Rodney Langton, MD, CDE, FAAFP [facility] [place], [place]

## 2013-06-26 NOTE — Progress Notes (Signed)
Patient here for  Hospital follow up Dx-pneumonia

## 2013-06-26 NOTE — Patient Instructions (Addendum)
Pruritus   Pruritis is an itch. There are many different problems that can cause an itch. Dry skin is one of the most common causes of itching. Most cases of itching do not require medical attention.   HOME CARE INSTRUCTIONS   Make sure your skin is moistened on a regular basis. A moisturizer that contains petroleum jelly is best for keeping moisture in your skin. If you develop a rash, you may try the following for relief:    Use corticosteroid cream.   Apply cool compresses to the affected areas.   Bathe with Epsom salts or baking soda in the bathwater.   Soak in colloidal oatmeal baths. These are available at your pharmacy.   Apply baking soda paste to the rash. Stir water into baking soda until it reaches a paste-like consistency.   Use an anti-itch lotion.   Take over-the-counter diphenhydramine medicine by mouth as the instructions direct.   Avoid scratching. Scratching may cause the rash to become infected. If itching is very bad, your caregiver may suggest prescription lotions or creams to lessen your symptoms.   Avoid hot showers, which can make itching worse. A cold shower may help with itching as long as you use a moisturizer after the shower.  SEEK MEDICAL CARE IF:  The itching does not go away after several days.  Document Released: 07/27/2011 Document Revised: 02/06/2012 Document Reviewed: 07/27/2011  [employer] Patient Information 2014 [employer].

## 2013-06-27 ENCOUNTER — Telehealth: Payer: Self-pay | Admitting: *Deleted

## 2013-06-27 LAB — COMPLETE METABOLIC PANEL WITH GFR
ALT: 31 U/L (ref 0–53)
AST: 24 U/L (ref 0–37)
Calcium: 9.9 mg/dL (ref 8.4–10.5)
Chloride: 99 mEq/L (ref 96–112)
Creat: 0.87 mg/dL (ref 0.50–1.35)
Sodium: 138 mEq/L (ref 135–145)
Total Bilirubin: 0.5 mg/dL (ref 0.3–1.2)
Total Protein: 7.2 g/dL (ref 6.0–8.3)

## 2013-06-27 NOTE — Telephone Encounter (Signed)
06/27/13 Unable to reach patient message left via telephone that all labs came back normal. Platelet count and  Sodium level came back normal. P.Kodah Maret,RM BSN MHA

## 2013-06-27 NOTE — Progress Notes (Signed)
Quick Note:  Please inform patient that his labs came back normal. His platelet count and sodium level came back normal.   Rodney Langton, MD, CDE, FAAFP [facility] [place], [place]   ______

## 2014-02-02 ENCOUNTER — Emergency Department (HOSPITAL_COMMUNITY)
Admission: EM | Admit: 2014-02-02 | Discharge: 2014-02-02 | Disposition: A | Discharge status: Home / Self Care | Payer: Managed Care, Other (non HMO) | Attending: Emergency Medicine | Admitting: Emergency Medicine

## 2014-02-02 ENCOUNTER — Encounter (HOSPITAL_COMMUNITY): Payer: Self-pay | Admitting: Emergency Medicine

## 2014-02-02 DIAGNOSIS — F17200 Nicotine dependence, unspecified, uncomplicated: Secondary | ICD-10-CM | POA: Insufficient documentation

## 2014-02-02 DIAGNOSIS — X503XXA Overexertion from repetitive movements, initial encounter: Secondary | ICD-10-CM | POA: Insufficient documentation

## 2014-02-02 DIAGNOSIS — M5380 Other specified dorsopathies, site unspecified: Secondary | ICD-10-CM | POA: Insufficient documentation

## 2014-02-02 DIAGNOSIS — Z8701 Personal history of pneumonia (recurrent): Secondary | ICD-10-CM | POA: Insufficient documentation

## 2014-02-02 DIAGNOSIS — Y92838 Other recreation area as the place of occurrence of the external cause: Secondary | ICD-10-CM

## 2014-02-02 DIAGNOSIS — Y9239 Other specified sports and athletic area as the place of occurrence of the external cause: Secondary | ICD-10-CM | POA: Insufficient documentation

## 2014-02-02 DIAGNOSIS — Z79899 Other long term (current) drug therapy: Secondary | ICD-10-CM | POA: Insufficient documentation

## 2014-02-02 DIAGNOSIS — IMO0002 Reserved for concepts with insufficient information to code with codable children: Secondary | ICD-10-CM | POA: Insufficient documentation

## 2014-02-02 DIAGNOSIS — Y9354 Activity, bowling: Secondary | ICD-10-CM | POA: Insufficient documentation

## 2014-02-02 DIAGNOSIS — M62830 Muscle spasm of back: Secondary | ICD-10-CM

## 2014-02-02 MED ORDER — KETOROLAC TROMETHAMINE 60 MG/2ML IM SOLN
60.0000 mg | Freq: Once | INTRAMUSCULAR | Status: AC
  Administered 2014-02-02: 60 mg via INTRAMUSCULAR
  Filled 2014-02-02: qty 2

## 2014-02-02 MED ORDER — DIAZEPAM 5 MG PO TABS: 5.0000 mg | ORAL_TABLET | Freq: Two times a day (BID) | ORAL | Status: DC

## 2014-02-02 MED ORDER — DIAZEPAM 5 MG PO TABS
5.0000 mg | ORAL_TABLET | Freq: Once | ORAL | Status: AC
  Administered 2014-02-02: 5 mg via ORAL
  Filled 2014-02-02: qty 1

## 2014-02-02 NOTE — ED Notes (Signed)
The pt is c/o lower back spasms since 2100.  He reports that he was in [facility]  4 months ago and was diagnosed with pneumonia in his back

## 2014-02-02 NOTE — ED Provider Notes (Signed)
Medical screening examination/treatment/procedure(s) were performed by non-physician practitioner and as supervising physician I was immediately available for consultation/collaboration.   EKG Interpretation None       Tresa Jolley M Marsean Elkhatib, MD 02/02/14 0738 

## 2014-02-02 NOTE — ED Notes (Signed)
The pt denies injury

## 2014-02-02 NOTE — ED Provider Notes (Signed)
CSN: [id number]     Arrival date & time 02/02/14  0014 History   First MD Initiated Contact with Patient 02/02/14 0059     Chief Complaint  Patient presents with  . Back Pain     (Consider location/radiation/quality/duration/timing/severity/associated sxs/prior Treatment) HPI Pt is a 23yo male with hx of back spasm after having pneumonia in his back 4 months ago.   Pt states today, back pain and spasms started around 2100 on 02/01/14 after bowling for several hours with friends. Pain is constant, sharp, shooting, worse in right lower back, 7/10. Worse with certain movements. Pt took ibuprofen PTA several hours ago w/o relief. Denies fever, cough, chest pain, SOB, n/v/d. Denies change in bowel or bladder habits. Denies numbness or tingling in arms or legs.  History reviewed. No pertinent past medical history. Past Surgical History  Procedure Laterality Date  . Leg surgery      2 screws in left leg, 2010   No family history on file. History  Substance Use Topics  . Smoking status: Current Every Day Smoker -- 0.50 packs/day for 5 years    Types: Cigarettes  . Smokeless tobacco: Not on file     Comment: smokes about a 1/2 pack a day  . Alcohol Use: No    Review of Systems  Constitutional: Negative for fever and chills.  Respiratory: Negative for cough and shortness of breath.   Cardiovascular: Negative for chest pain.  Gastrointestinal: Negative for nausea, vomiting, abdominal pain and diarrhea.  Genitourinary: Negative for dysuria and flank pain.  Musculoskeletal: Positive for back pain and myalgias.  Neurological: Negative for weakness and numbness.  All other systems reviewed and are negative.      Allergies  Review of patient's allergies indicates no known allergies.  Home Medications   Current Outpatient Rx  Name  Route  Sig  Dispense  Refill  . bisacodyl (DULCOLAX) 5 MG EC tablet   Oral   Take 2 tablets (10 mg total) by mouth daily as needed.   30 tablet   0   .  camphor-menthol (SARNA) lotion   Topical   Apply topically as needed for itching.   222 mL   1   . diazepam (VALIUM) 5 MG tablet   Oral   Take 1 tablet (5 mg total) by mouth 2 (two) times daily.   10 tablet   0   . hydrOXYzine (ATARAX/VISTARIL) 25 MG tablet   Oral   Take 1 tablet (25 mg total) by mouth every 4 (four) hours as needed for itching.   30 tablet   0   . ibuprofen (ADVIL,MOTRIN) 200 MG tablet   Oral   Take 400 mg by mouth every 6 (six) hours as needed for pain.         Marland Kitchen EXPIRED: zolpidem (AMBIEN) 10 MG tablet   Oral   Take 1 tablet (10 mg total) by mouth at bedtime as needed for sleep.   30 tablet   0    BP 130/80  Pulse 56  Temp(Src) 97.9 F (36.6 C) (Oral)  Resp 22  Ht 5\' 9"  (1.753 m)  Wt 158 lb (71.668 kg)  BMI 23.32 kg/m2  SpO2 99% Physical Exam  Nursing note and vitals reviewed. Constitutional: He appears well-developed and well-nourished.  Pt sitting in exam chair, leaning toward left side. Appears uncomfortable.  HENT:  Head: Normocephalic and atraumatic.  Eyes: Conjunctivae are normal. No scleral icterus.  Neck: Normal range of motion. Neck supple.  Cardiovascular:  Normal rate, regular rhythm and normal heart sounds.   Pulmonary/Chest: Effort normal and breath sounds normal. No respiratory distress. He has no wheezes. He has no rales. He exhibits no tenderness.  No respiratory distress, able to speak in full sentences w/o difficulty. Lungs: CTAB   Abdominal: Soft. Bowel sounds are normal. He exhibits no distension and no mass. There is no tenderness. There is no rebound and no guarding.  Musculoskeletal: He exhibits tenderness.  Able to move all 4 extremities w/o difficulty. Tenderness along right thoracic and lumbar paraspinal muscles. palpable muscle spasms. No midline spinal tenderness.   Neurological: He is alert.  Antalgic gait.  Skin: Skin is warm and dry.    ED Course  Procedures (including critical care time) Labs Review Labs  Reviewed - No data to display Imaging Review No results found.   EKG Interpretation None      MDM   Final diagnoses:  Back muscle spasm    Pt with hx of back spasms c/o same that started after bowling with friends. Denies falls or known injury. No midline spinal tenderness. Lungs: CTAB. No respiratory distress. palpable muscle spasm in thoracic and lumbar musculature on right side. Rx: valium and ibuprofen. Advised to f/u with PCP. Return precautions provided. Pt verbalized understanding and agreement with tx plan.     Junius FinnerErin O'Malley, PA-C 02/02/14 [phone]

## 2017-01-17 ENCOUNTER — Emergency Department (HOSPITAL_COMMUNITY)
Admission: EM | Admit: 2017-01-17 | Discharge: 2017-01-17 | Disposition: A | Discharge status: Home / Self Care | Payer: BLUE CROSS/BLUE SHIELD | Attending: Emergency Medicine | Admitting: Emergency Medicine

## 2017-01-17 ENCOUNTER — Emergency Department (HOSPITAL_COMMUNITY): Payer: BLUE CROSS/BLUE SHIELD

## 2017-01-17 ENCOUNTER — Encounter (HOSPITAL_COMMUNITY): Payer: Self-pay | Admitting: Emergency Medicine

## 2017-01-17 DIAGNOSIS — M791 Myalgia, unspecified site: Secondary | ICD-10-CM

## 2017-01-17 DIAGNOSIS — F17210 Nicotine dependence, cigarettes, uncomplicated: Secondary | ICD-10-CM | POA: Insufficient documentation

## 2017-01-17 DIAGNOSIS — R109 Unspecified abdominal pain: Secondary | ICD-10-CM | POA: Diagnosis not present

## 2017-01-17 DIAGNOSIS — R55 Syncope and collapse: Secondary | ICD-10-CM | POA: Diagnosis present

## 2017-01-17 DIAGNOSIS — Z79899 Other long term (current) drug therapy: Secondary | ICD-10-CM | POA: Insufficient documentation

## 2017-01-17 LAB — CBC
HCT: 43.9 % (ref 39.0–52.0)
Hemoglobin: 15.6 g/dL (ref 13.0–17.0)
MCH: 29.7 pg (ref 26.0–34.0)
MCHC: 35.5 g/dL (ref 30.0–36.0)
MCV: 83.6 fL (ref 78.0–100.0)
PLATELETS: 231 10*3/uL (ref 150–400)
RBC: 5.25 MIL/uL (ref 4.22–5.81)
RDW: 12.6 % (ref 11.5–15.5)
WBC: 10.4 10*3/uL (ref 4.0–10.5)

## 2017-01-17 LAB — URINALYSIS, ROUTINE W REFLEX MICROSCOPIC
BACTERIA UA: NONE SEEN
Bilirubin Urine: NEGATIVE
GLUCOSE, UA: NEGATIVE mg/dL
HGB URINE DIPSTICK: NEGATIVE
Ketones, ur: 5 mg/dL — AB
Leukocytes, UA: NEGATIVE
NITRITE: NEGATIVE
Protein, ur: NEGATIVE mg/dL
SPECIFIC GRAVITY, URINE: 1.027 (ref 1.005–1.030)
pH: 5 (ref 5.0–8.0)

## 2017-01-17 LAB — BASIC METABOLIC PANEL
Anion gap: 9 (ref 5–15)
BUN: 13 mg/dL (ref 6–20)
CALCIUM: 10 mg/dL (ref 8.9–10.3)
CO2: 27 mmol/L (ref 22–32)
CREATININE: 1.02 mg/dL (ref 0.61–1.24)
Chloride: 104 mmol/L (ref 101–111)
GFR calc Af Amer: >60 mL/min (ref >60)
GLUCOSE: 75 mg/dL (ref 65–99)
Potassium: 3.8 mmol/L (ref 3.5–5.1)
Sodium: 140 mmol/L (ref 135–145)

## 2017-01-17 LAB — CBG MONITORING, ED: GLUCOSE-CAPILLARY: 95 mg/dL (ref 65–99)

## 2017-01-17 MED ORDER — MORPHINE SULFATE (PF) 4 MG/ML IV SOLN
4.0000 mg | Freq: Once | INTRAVENOUS | Status: AC
Start: 2017-01-17 — End: 2017-01-17
  Administered 2017-01-17: 4 mg via INTRAVENOUS
  Filled 2017-01-17: qty 1

## 2017-01-17 MED ORDER — SODIUM CHLORIDE 0.9 % IV BOLUS (SEPSIS)
1000.0000 mL | Freq: Once | INTRAVENOUS | Status: AC
  Administered 2017-01-17: 1000 mL via INTRAVENOUS

## 2017-01-17 MED ORDER — ONDANSETRON HCL 4 MG/2ML IJ SOLN
4.0000 mg | Freq: Once | INTRAMUSCULAR | Status: AC
Start: 2017-01-17 — End: 2017-01-17
  Administered 2017-01-17: 4 mg via INTRAVENOUS
  Filled 2017-01-17: qty 2

## 2017-01-17 MED ORDER — IOPAMIDOL (ISOVUE-300) INJECTION 61%
INTRAVENOUS | Status: AC
  Filled 2017-01-17: qty 100

## 2017-01-17 MED ORDER — METHOCARBAMOL 500 MG PO TABS: 500.0000 mg | ORAL_TABLET | Freq: Two times a day (BID) | ORAL | 0 refills | Status: DC

## 2017-01-17 MED ORDER — IOPAMIDOL (ISOVUE-300) INJECTION 61%
INTRAVENOUS | Status: DC
Start: 2017-01-17 — End: 2017-01-18
  Filled 2017-01-17: qty 30

## 2017-01-17 MED ORDER — SODIUM CHLORIDE 0.9 % IJ SOLN
INTRAMUSCULAR | Status: AC
  Filled 2017-01-17: qty 50

## 2017-01-17 MED ORDER — NAPROXEN 500 MG PO TABS: 500.0000 mg | ORAL_TABLET | Freq: Two times a day (BID) | ORAL | 0 refills | Status: DC

## 2017-01-17 MED ORDER — IOPAMIDOL (ISOVUE-300) INJECTION 61%
100.0000 mL | Freq: Once | INTRAVENOUS | Status: AC | PRN
  Administered 2017-01-17: 100 mL via INTRAVENOUS

## 2017-01-17 NOTE — ED Triage Notes (Signed)
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 Patient lowered down and when got inside truck he had syncopal episode that lasted for about 10 seconds.

## 2017-01-17 NOTE — ED Provider Notes (Signed)
[facility]-EMERGENCY DEPT Provider Note   CSN: [id number] Arrival date & time: 01/17/17  1733     History   Chief Complaint Chief Complaint  Patient presents with  . Loss of Consciousness  . Dizziness  . Back Pain    HPI Joshua Guerra is a 26 y.o. male. He presents after a syncopal episode.  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 He was reaching above his head with both hands on a drill.  He admits this was a bit of position. Sudden pain to his back and shoulder that radiated to his abdomen. He states he felt lightheaded and dizzy and had a sudden urge that he was going to have about that. Was able to lower to the ground. He walked to the truck. He states he had syncopal episode and became diaphoretic. He still complains of abdominal pain.  History reviewed. No pertinent past medical history.  Patient Active Problem List   Diagnosis Date Noted  . Rash and nonspecific skin eruption 06/26/2013  . Pruritus 06/26/2013  . Hyponatremia 06/26/2013  . Anemia 06/26/2013    Past Surgical History:  Procedure Laterality Date  . LEG SURGERY     2 screws in left leg, 2010       Home Medications    Prior to Admission medications   Medication Sig Start Date End Date Taking? Authorizing Provider  Pseudoeph-Doxylamine-DM-APAP (NYQUIL PO) Take 30 mLs by mouth at bedtime.   Yes Historical Provider, MD  Pseudoephedrine-APAP-DM (DAYQUIL PO) Take 30 mLs by mouth daily as needed (cold).   Yes Historical Provider, MD  methocarbamol (ROBAXIN) 500 MG tablet Take 1 tablet (500 mg total) by mouth 2 (two) times daily. 01/17/17   Rolland PorterMark Aikam Vinje, MD  naproxen (NAPROSYN) 500 MG tablet Take 1 tablet (500 mg total) by mouth 2 (two) times daily. 01/17/17   Rolland PorterMark Aarib Pulido, MD  zolpidem (AMBIEN) 10 MG tablet Take 1 tablet (10 mg total) by mouth at bedtime as needed for sleep. 06/12/13 07/12/13  Dorothea OgleIskra M Myers, MD     Family History No family history on file.  Social History Social History  Substance Use Topics  . Smoking status: Current Every Day Smoker    Packs/day: 0.50    Years: 5.00    Types: Cigarettes  . Smokeless tobacco: Never Used     Comment: smokes about a 1/2 pack a day  . Alcohol use No     Allergies   Patient has no known allergies.   Review of Systems Review of Systems  Constitutional: Negative for appetite change, chills, diaphoresis, fatigue and fever.  HENT: Negative for mouth sores, sore throat and trouble swallowing.   Eyes: Negative for visual disturbance.  Respiratory: Negative for cough, chest tightness, shortness of breath and wheezing.   Cardiovascular: Negative for chest pain.  Gastrointestinal: Positive for abdominal pain. Negative for abdominal distention, diarrhea, nausea and vomiting.  Endocrine: Negative for polydipsia, polyphagia and polyuria.  Genitourinary: Negative for dysuria, frequency and hematuria.  Musculoskeletal: Negative for gait problem.  Skin: Negative for color change, pallor and rash.  Neurological: Positive for syncope. Negative for dizziness, light-headedness and headaches.  Hematological: Does not bruise/bleed easily.  Psychiatric/Behavioral: Negative for behavioral problems and confusion.     Physical Exam Updated Vital Signs BP 124/59   Pulse 63   Temp 98 F (36.7 C) (Oral)   Resp 13   Ht  5' 9.5" (1.765 m)   Wt 180 lb (81.6 kg)   SpO2 96%   BMI 26.20 kg/m   Physical Exam  Constitutional: He is oriented to person, place, and time. He appears well-developed and well-nourished. No distress.  HENT:  Head: Normocephalic.  Eyes: Conjunctivae are normal. Pupils are equal, round, and reactive to light. No scleral icterus.  Neck: Normal range of motion. Neck supple. No thyromegaly present.  Cardiovascular: Normal rate and regular rhythm.  Exam reveals no gallop and no friction rub.   No murmur heard. Pulmonary/Chest:  Effort normal and breath sounds normal. No respiratory distress. He has no wheezes. He has no rales.  Abdominal: Soft. Bowel sounds are normal. He exhibits no distension. There is no tenderness. There is no rebound.  Musculoskeletal: Normal range of motion.  TTP in musculature of mid and low back, L>R. Negative straight leg exam.  Neurological: He is alert and oriented to person, place, and time.  Skin: Skin is warm and dry. No rash noted.  Psychiatric: He has a normal mood and affect. His behavior is normal.     ED Treatments / Results  Labs (all labs ordered are listed, but only abnormal results are displayed) Labs Reviewed  URINALYSIS, ROUTINE W REFLEX MICROSCOPIC - Abnormal; Notable for the following:       Result Value   Ketones, ur 5 (*)    Squamous Epithelial / LPF 0-5 (*)    All other components within normal limits  BASIC METABOLIC PANEL  CBC  CBG MONITORING, ED    EKG  EKG Interpretation None       Radiology Ct Abdomen Pelvis W Contrast  Result Date: 01/17/2017 CLINICAL DATA:  Lifting injury, severe mid to lower back pain radiating to abdomen, syncopal episode. EXAM: CT ABDOMEN AND PELVIS WITH CONTRAST TECHNIQUE: Multidetector CT imaging of the abdomen and pelvis was performed using the standard protocol following bolus administration of intravenous contrast. CONTRAST:  ISOVUE-300 IOPAMIDOL (ISOVUE-300) INJECTION 61% COMPARISON:  CT abdomen and pelvis June 09, 2013 FINDINGS: LOWER CHEST: Lung bases are clear. Included heart size is normal. No pericardial effusion. HEPATOBILIARY: Liver and gallbladder are normal. PANCREAS: Normal. SPLEEN: Normal. ADRENALS/URINARY TRACT: Kidneys are orthotopic, demonstrating symmetric enhancement. No nephrolithiasis, hydronephrosis or solid renal masses. The unopacified ureters are normal in course and caliber. Urinary bladder is partially distended and unremarkable. Normal air-filled adrenal glands. STOMACH/BOWEL: The stomach, small  and large bowel are normal in course and caliber without inflammatory changes. Mild amount of retained large bowel stool. Normal appendix. VASCULAR/LYMPHATIC: Aortoiliac vessels are normal in course and caliber. No lymphadenopathy by CT size criteria. Subcentimeter mesenteric lymph nodes are likely reactive. REPRODUCTIVE: Normal. OTHER: No intraperitoneal free fluid or free air. Phleboliths RIGHT pelvis. MUSCULOSKELETAL: Nonacute. IMPRESSION: Negative CT abdomen and pelvis. Electronically Signed   By: Awilda Metro M.D.   On: 01/17/2017 22:12    Procedures Procedures (including critical care time)  Medications Ordered in ED Medications  iopamidol (ISOVUE-300) 61 % injection (not administered)  iopamidol (ISOVUE-300) 61 % injection (not administered)  sodium chloride 0.9 % injection (not administered)  sodium chloride 0.9 % bolus 1,000 mL (0 mLs Intravenous Stopped 01/17/17 2235)  ondansetron (ZOFRAN) injection 4 mg (4 mg Intravenous Given 01/17/17 2047)  morphine 4 MG/ML injection 4 mg (4 mg Intravenous Given 01/17/17 2047)  iopamidol (ISOVUE-300) 61 % injection 100 mL (100 mLs Intravenous Contrast Given 01/17/17 2158)     Initial Impression / Assessment and Plan / ED Course  I  have reviewed the triage vital signs and the nursing notes.  Pertinent labs & imaging results that were available during my care of the patient were reviewed by me and considered in my medical decision making (see chart for details).     Labs and Ct negative. Probable muscular spasm related to positioning. appropriate for DC c muscle relaxant and NSAID>  Final Clinical Impressions(s) / ED Diagnoses   Final diagnoses:  Vasovagal syncope  Muscular aches    New Prescriptions New Prescriptions   METHOCARBAMOL (ROBAXIN) 500 MG TABLET    Take 1 tablet (500 mg total) by mouth 2 (two) times daily.   NAPROXEN (NAPROSYN) 500 MG TABLET    Take 1 tablet (500 mg total) by mouth 2 (two) times daily.     Rolland Porter,  MD 01/17/17 2258

## 2017-01-17 NOTE — ED Notes (Signed)
Pt reports he was at work today, when he started to have back pain radiating to his L abdomen.  States he went in his car to rest when he had a syncopal episode.  Pt reports he has had a h/a all day.  At present, he denies dizziness or any cp.  Pt is A&Ox 4.  Reports L side abd pain.

## 2017-09-26 IMAGING — CT CT ABD-PELV W/ CM
2 of 4 series · 17 of 46 positions shown, 19 images · IV contrast (ISOVUE)
Comparison: CT abdomen and pelvis June 09, 2013

CLINICAL DATA: Lifting injury, severe mid to lower back pain
radiating to abdomen, syncopal episode.

EXAM:
CT ABDOMEN AND PELVIS WITH CONTRAST
TECHNIQUE: Multidetector CT imaging of the abdomen and pelvis was performed
using the standard protocol following bolus administration of
intravenous contrast.
CONTRAST:  100mL AOOMNY-TUU IOPAMIDOL (AOOMNY-TUU) INJECTION 61%

[Series 2: abd/pel with · axial · 0.76mm/px · z∈[+985,+1380]mm · 14 of 89 slices shown, 16 images]
[im 5/89  soft-tissue]
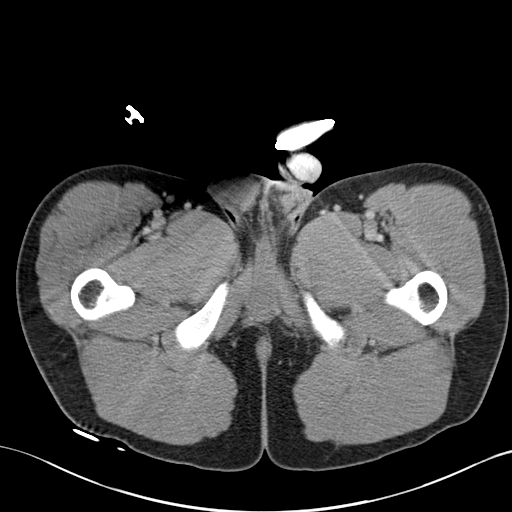
[im 5/89  bone]
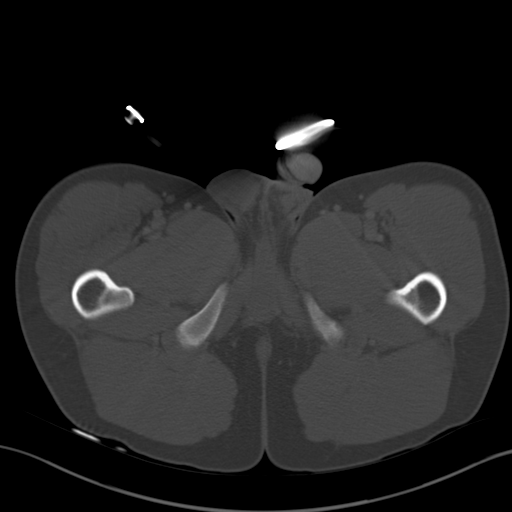
[im 14/89  soft-tissue]
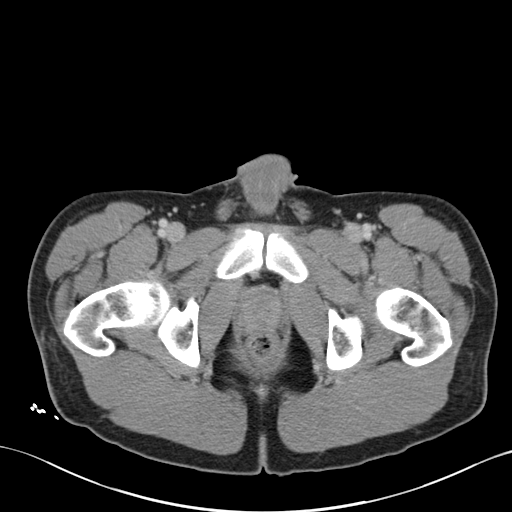
[im 18/89  soft-tissue]
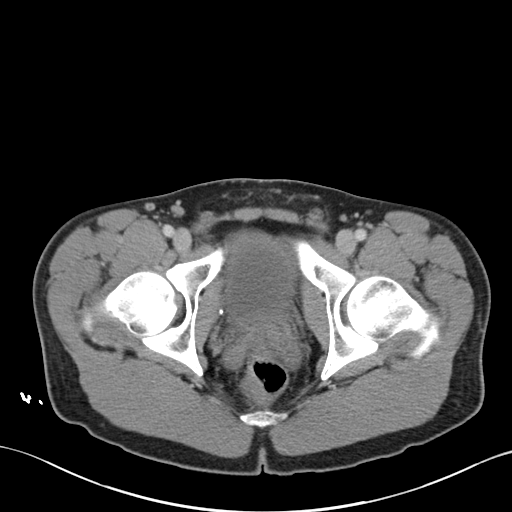
[im 23/89  soft-tissue]
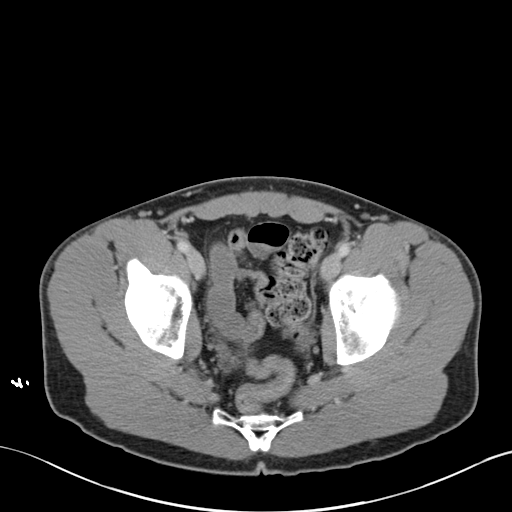
[im 31/89  soft-tissue]
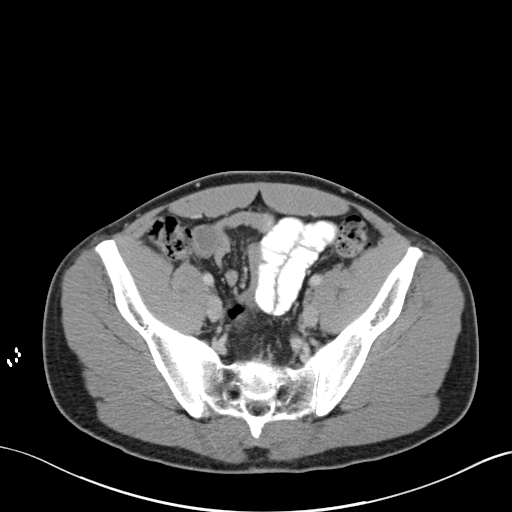
[im 36/89  soft-tissue]
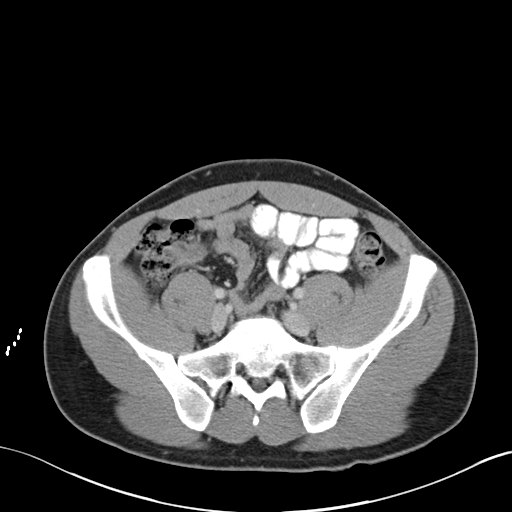
[im 40/89  soft-tissue]
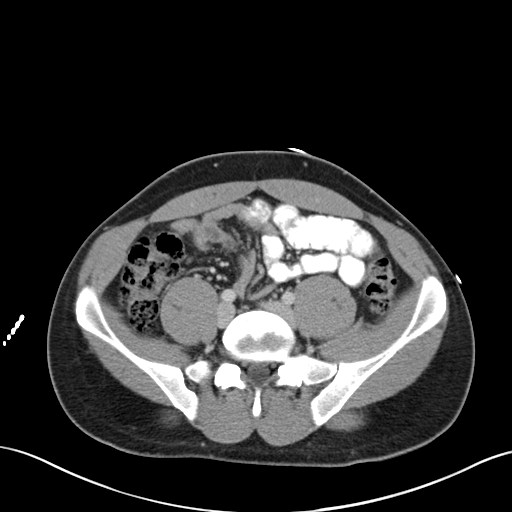
[im 49/89  soft-tissue]
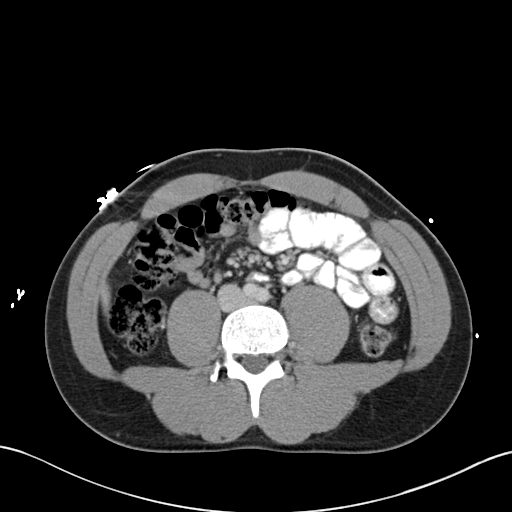
[im 53/89  soft-tissue]
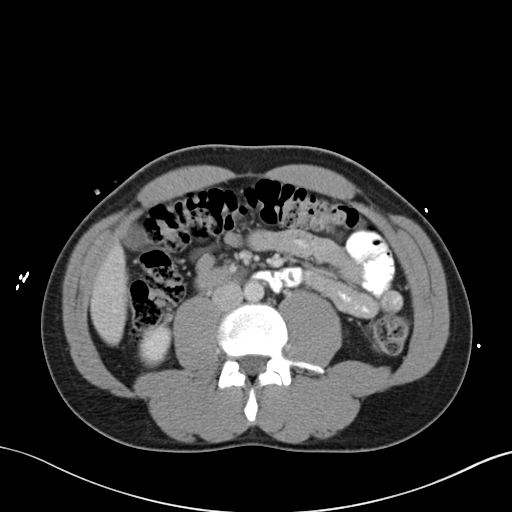
[im 53/89  bone]
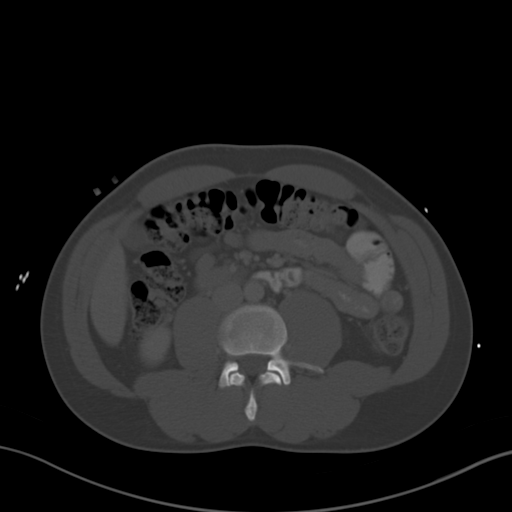
[im 58/89  soft-tissue]
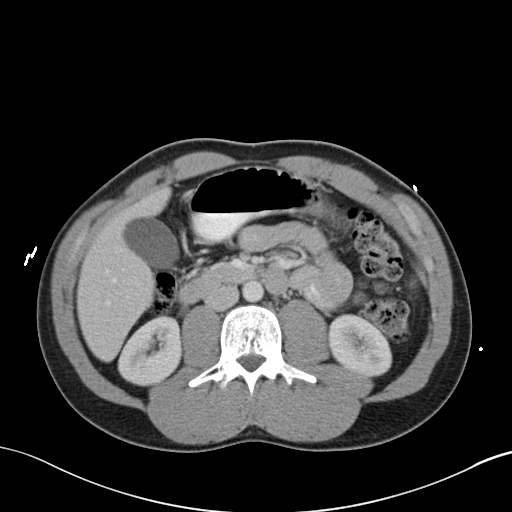
[im 67/89  soft-tissue]
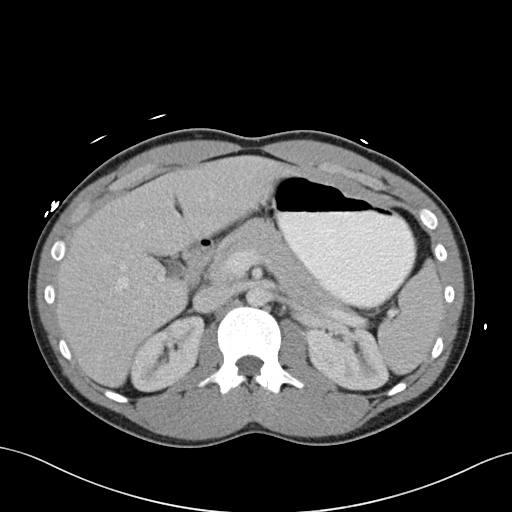
[im 71/89  soft-tissue]
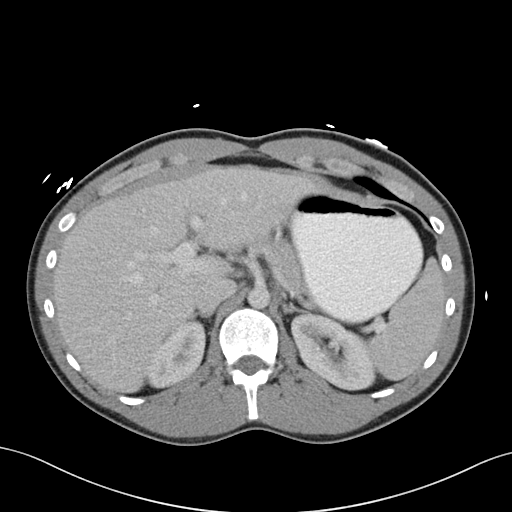
[im 75/89  soft-tissue]
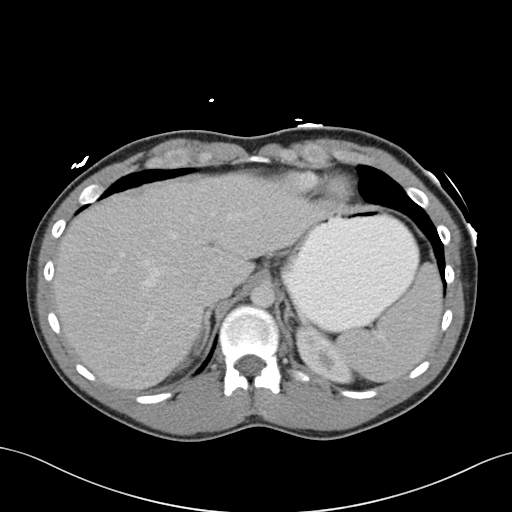
[im 84/89  soft-tissue]
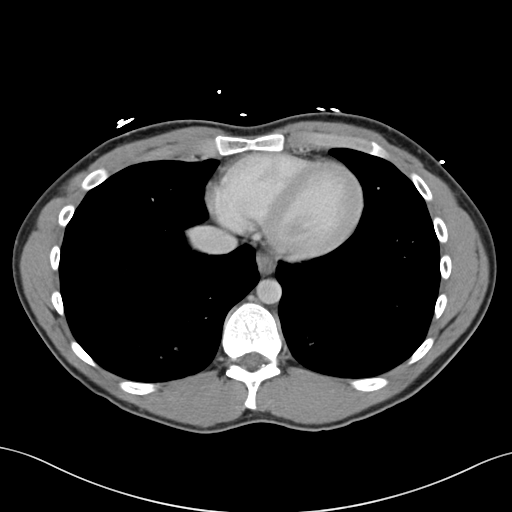

[Series 5: coronal a/|p · coronal · 0.76mm/px · 3 of 133 slices shown]
[im 45/133  soft-tissue]
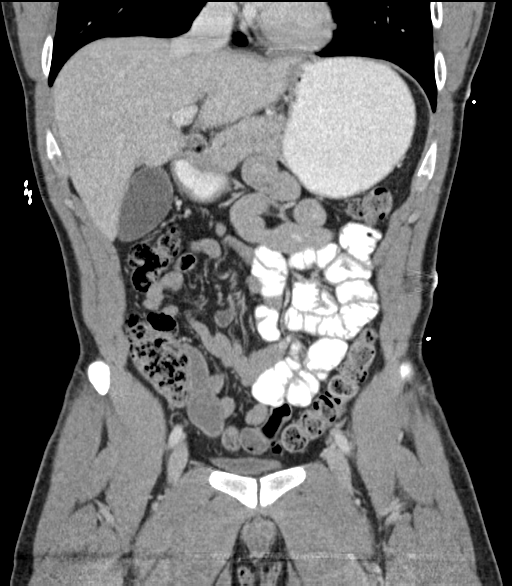
[im 59/133  soft-tissue]
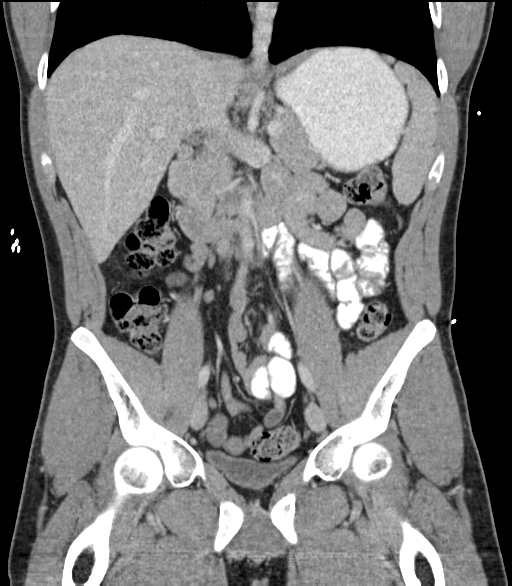
[im 74/133  soft-tissue]
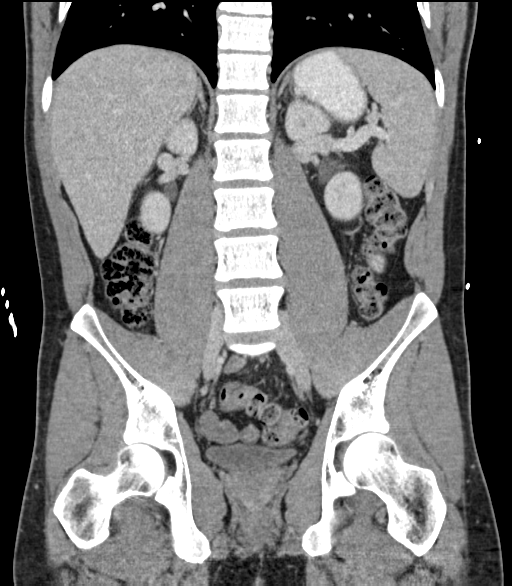

[17 of 46 positions shown; findings below may reference images not displayed]

FINDINGS: LOWER CHEST: Lung bases are clear. Included heart size is normal. No
pericardial effusion.

HEPATOBILIARY: Liver and gallbladder are normal.

PANCREAS: Normal.

SPLEEN: Normal.

ADRENALS/URINARY TRACT: Kidneys are orthotopic, demonstrating
symmetric enhancement. No nephrolithiasis, hydronephrosis or solid
renal masses. The unopacified ureters are normal in course and
caliber. Urinary bladder is partially distended and unremarkable.
Normal air-filled adrenal glands.

STOMACH/BOWEL: The stomach, small and large bowel are normal in
course and caliber without inflammatory changes. Mild amount of
retained large bowel stool. Normal appendix.

VASCULAR/LYMPHATIC: Aortoiliac vessels are normal in course and
caliber. No lymphadenopathy by CT size criteria. Subcentimeter
mesenteric lymph nodes are likely reactive.

REPRODUCTIVE: Normal.

OTHER: No intraperitoneal free fluid or free air. Phleboliths RIGHT
pelvis.

MUSCULOSKELETAL: Nonacute.
IMPRESSION: Negative CT abdomen and pelvis.

## 2019-09-29 ENCOUNTER — Encounter (HOSPITAL_COMMUNITY): Payer: Self-pay

## 2019-09-29 ENCOUNTER — Other Ambulatory Visit: Payer: Self-pay

## 2019-09-29 ENCOUNTER — Ambulatory Visit (HOSPITAL_COMMUNITY)
Admission: EM | Admit: 2019-09-29 | Discharge: 2019-09-29 | Disposition: A | Discharge status: Home / Self Care | Payer: 59 | Attending: Internal Medicine | Admitting: Internal Medicine

## 2019-09-29 ENCOUNTER — Ambulatory Visit (INDEPENDENT_AMBULATORY_CARE_PROVIDER_SITE_OTHER): Payer: 59

## 2019-09-29 DIAGNOSIS — R1032 Left lower quadrant pain: Secondary | ICD-10-CM

## 2019-09-29 DIAGNOSIS — N23 Unspecified renal colic: Secondary | ICD-10-CM | POA: Diagnosis not present

## 2019-09-29 DIAGNOSIS — N200 Calculus of kidney: Secondary | ICD-10-CM | POA: Insufficient documentation

## 2019-09-29 MED ORDER — KETOROLAC TROMETHAMINE 60 MG/2ML IM SOLN
60.0000 mg | Freq: Once | INTRAMUSCULAR | Status: AC
  Administered 2019-09-29: 15:00:00 60 mg via INTRAMUSCULAR

## 2019-09-29 MED ORDER — KETOROLAC TROMETHAMINE 60 MG/2ML IM SOLN
INTRAMUSCULAR | Status: AC
  Filled 2019-09-29: qty 2

## 2019-09-29 MED ORDER — TAMSULOSIN HCL 0.4 MG PO CAPS: 0.4000 mg | ORAL_CAPSULE | Freq: Every day | ORAL | 0 refills | Status: DC

## 2019-09-29 MED ORDER — IBUPROFEN 600 MG PO TABS: 600.0000 mg | ORAL_TABLET | Freq: Four times a day (QID) | ORAL | 0 refills | Status: DC | PRN

## 2019-09-29 NOTE — ED Triage Notes (Signed)
Pt present flank pain, symptoms started at 1am this morning. Pt states that the pain goes all the way around his stomach and lower back.

## 2019-09-29 NOTE — ED Provider Notes (Signed)
[facility]    CSN: [id number] Arrival date & time: 09/29/19  1328      History   Chief Complaint Chief Complaint  Patient presents with  . Flank Pain    HPI Joshua Guerra is a 28 y.o. male with no past medical history comes to urgent care on account of sudden onset left lower quadrant/left flank pain of a few hours duration.  Pain is colicky and constant.  Currently 10 out of 10.  No known aggravating factors.  No known relieving factors.  Patient has not tried any over-the-counter medication.  No nausea or vomiting.  He has some diaphoresis but no fever or chills.  Pain radiating to the groin.  Patient initially had some dysuria and frequency as well as urgency but that has currently resolved. HPI  History reviewed. No pertinent past medical history.  Patient Active Problem List   Diagnosis Date Noted  . Rash and nonspecific skin eruption 06/26/2013  . Pruritus 06/26/2013  . Hyponatremia 06/26/2013  . Anemia 06/26/2013    Past Surgical History:  Procedure Laterality Date  . LEG SURGERY     2 screws in left leg, 2010       Home Medications    Prior to Admission medications   Medication Sig Start Date End Date Taking? Authorizing Provider  ibuprofen (ADVIL) 600 MG tablet Take 1 tablet (600 mg total) by mouth every 6 (six) hours as needed. 09/29/19   Roshan Salamon, Myrene Galas, MD  methocarbamol (ROBAXIN) 500 MG tablet Take 1 tablet (500 mg total) by mouth 2 (two) times daily. 01/17/17   Tanna Furry, MD  naproxen (NAPROSYN) 500 MG tablet Take 1 tablet (500 mg total) by mouth 2 (two) times daily. 01/17/17   Tanna Furry, MD  Pseudoeph-Doxylamine-DM-APAP (NYQUIL PO) Take 30 mLs by mouth at bedtime.    [provider]  Pseudoephedrine-APAP-DM (DAYQUIL PO) Take 30 mLs by mouth daily as needed (cold).    [provider]  tamsulosin (FLOMAX) 0.4 MG CAPS capsule Take 1 capsule (0.4 mg total) by mouth daily. 09/29/19   Jaquavius Hudler, Myrene Galas, MD   zolpidem (AMBIEN) 10 MG tablet Take 1 tablet (10 mg total) by mouth at bedtime as needed for sleep. 06/12/13 07/12/13  Theodis Blaze, MD    Family History History reviewed. No pertinent family history.  Social History Social History   Tobacco Use  . Smoking status: Current Every Day Smoker    Packs/day: 0.50    Years: 5.00    Pack years: 2.50    Types: Cigarettes  . Smokeless tobacco: Never Used  . Tobacco comment: smokes about a 1/2 pack a day  Substance Use Topics  . Alcohol use: No  . Drug use: No     Allergies   Patient has no known allergies.   Review of Systems Review of Systems  Constitutional: Positive for activity change and diaphoresis. Negative for chills, fatigue and fever.  HENT: Negative.   Respiratory: Negative.   Cardiovascular: Negative.   Gastrointestinal: Positive for abdominal pain. Negative for diarrhea, nausea and vomiting.  Endocrine: Negative.   Genitourinary: Positive for dysuria and urgency. Negative for enuresis and frequency.  Musculoskeletal: Negative.   Skin: Negative.   Neurological: Positive for dizziness and weakness. Negative for light-headedness.  Psychiatric/Behavioral: Negative.      Physical Exam Triage Vital Signs ED Triage Vitals  Enc Vitals Group     BP 09/29/19 1414 122/65     Pulse Rate 09/29/19 1414 91  Resp 09/29/19 1414 16     Temp 09/29/19 1414 98.1 F (36.7 C)     Temp Source 09/29/19 1414 Temporal     SpO2 09/29/19 1414 98 %     Weight --      Height --      Head Circumference --      Peak Flow --      Pain Score 09/29/19 1419 10     Pain Loc --      Pain Edu? --      Excl. in GC? --    No data found.  Updated Vital Signs BP 122/65 (BP Location: Left Arm)   Pulse 91   Temp 98.1 F (36.7 C) (Temporal)   Resp 16   SpO2 98%   Visual Acuity Right Eye Distance:   Left Eye Distance:   Bilateral Distance:    Right Eye Near:   Left Eye Near:    Bilateral Near:     Physical Exam Vitals signs  and nursing note reviewed.  Constitutional:      General: He is in acute distress.     Appearance: He is ill-appearing. He is not toxic-appearing.  HENT:     Right Ear: Tympanic membrane normal.     Left Ear: Tympanic membrane normal.  Neck:     Musculoskeletal: Normal range of motion. No neck rigidity.  Cardiovascular:     Rate and Rhythm: Normal rate and regular rhythm.     Pulses: Normal pulses.     Heart sounds: Murmur present.  Pulmonary:     Effort: Pulmonary effort is normal.     Breath sounds: Normal breath sounds.  Abdominal:     General: Bowel sounds are normal.     Palpations: Abdomen is soft.     Tenderness: There is no abdominal tenderness. There is left CVA tenderness. There is no guarding or rebound.  Musculoskeletal: Normal range of motion.        General: No swelling, deformity or signs of injury.  Lymphadenopathy:     Cervical: No cervical adenopathy.  Skin:    General: Skin is warm.     Capillary Refill: Capillary refill takes less than 2 seconds.     Coloration: Skin is not jaundiced.     Findings: No bruising or erythema.  Neurological:     General: No focal deficit present.     Mental Status: He is alert and oriented to person, place, and time.  Psychiatric:        Mood and Affect: Mood normal.      UC Treatments / Results  Labs (all labs ordered are listed, but only abnormal results are displayed) Labs Reviewed  URINE CULTURE - Abnormal; Notable for the following components:      Result Value   Culture >=100,000 COLONIES/mL PROTEUS MIRABILIS (*)    Organism ID, Bacteria PROTEUS MIRABILIS (*)    All other components within normal limits  POCT URINALYSIS DIP (DEVICE) - Abnormal; Notable for the following components:   Glucose, UA 100 (*)    Bilirubin Urine SMALL (*)    Ketones, ur TRACE (*)    Hgb urine dipstick LARGE (*)    pH 8.5 (*)    Protein, ur 100 (*)    Urobilinogen, UA 2.0 (*)    Nitrite POSITIVE (*)    Leukocytes,Ua SMALL (*)     All other components within normal limits    EKG   Radiology Dg Abdomen 1 View  Result Date:  09/29/2019 CLINICAL DATA:  LEFT lower flank pain for 1 day. EXAM: ABDOMEN - 1 VIEW COMPARISON:  CT abdomen dated 01/17/2017. FINDINGS: Visualized bowel gas pattern is nonobstructive. No evidence of soft tissue mass or abnormal fluid collection. No evidence of renal or ureteral calculi. Visualized osseous structures are unremarkable. IMPRESSION: Normal plain film examination of the abdomen. No evidence of renal or ureteral calculi. Electronically Signed   By: Bary Richard M.D.   On: 09/29/2019 15:16    Procedures Procedures (including critical care time)  Medications Ordered in UC Medications  ketorolac (TORADOL) injection 60 mg (60 mg Intramuscular Given 09/29/19 1507)  ketorolac (TORADOL) 60 MG/2ML injection (has no administration in time range)    Initial Impression / Assessment and Plan / UC Course  I have reviewed the triage vital signs and the nursing notes.  Pertinent labs & imaging results that were available during my care of the patient were reviewed by me and considered in my medical decision making (see chart for details).     1.  Right flank pain, suspected ureterolithiasis with ureteric colic: Urinalysis is remarkable for blood, ketones, leukocyte esterase, nitrites and a specific gravity of 1.020 as well as protein. Urine cultures have been sent Patient is encouraged to push fluids Tamsulosin 0.4 mg orally daily Ibuprofen 600 mg every 6 hours as needed for pain If patient develops fever, chills as well as change in his mental status he is advised to go to the emergency department to be reevaluated.  If flank pain is persistent, that may indicate known passage of ureteric stone hence urology evaluation may be appropriate. Final Clinical Impressions(s) / UC Diagnoses   Final diagnoses:  Ureteral colic  Kidney stone   Discharge Instructions   None    ED Prescriptions     Medication Sig Dispense Auth. Provider   ibuprofen (ADVIL) 600 MG tablet Take 1 tablet (600 mg total) by mouth every 6 (six) hours as needed. 30 tablet Saralyn Willison, Britta Mccreedy, MD   tamsulosin (FLOMAX) 0.4 MG CAPS capsule Take 1 capsule (0.4 mg total) by mouth daily. 14 capsule Ranette Luckadoo, Britta Mccreedy, MD     PDMP not reviewed this encounter.   Merrilee Jansky, MD 10/01/19 1024

## 2019-09-30 LAB — POCT URINALYSIS DIP (DEVICE)
Glucose, UA: 100 mg/dL — AB
Nitrite: POSITIVE — AB
Protein, ur: 100 mg/dL — AB
Specific Gravity, Urine: 1.02 (ref 1.005–1.030)
Urobilinogen, UA: 2 mg/dL — ABNORMAL HIGH (ref 0.0–1.0)
pH: 8.5 — ABNORMAL HIGH (ref 5.0–8.0)

## 2019-10-01 ENCOUNTER — Telehealth (HOSPITAL_COMMUNITY): Payer: Self-pay | Admitting: Emergency Medicine

## 2019-10-01 LAB — URINE CULTURE: Culture: >=100000 — AB

## 2019-10-01 NOTE — Telephone Encounter (Signed)
Per Dr. Lanny Cramp, pt needs Ciprofloxacin 500mg  BID for 5 days. Contacted patient to let him know. 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 Offered to send medication to a location on the way, pt stated he would rather call back on Friday. Pt will call back Friday for where the medication needs to go.

## 2019-10-04 ENCOUNTER — Telehealth (HOSPITAL_COMMUNITY): Payer: Self-pay | Admitting: Emergency Medicine

## 2019-10-04 MED ORDER — CIPROFLOXACIN HCL 500 MG PO TABS: 500.0000 mg | ORAL_TABLET | Freq: Two times a day (BID) | ORAL | 0 refills | Status: AC

## 2019-10-04 NOTE — Telephone Encounter (Signed)
Contacted patient, pt is returning back home, requesting to send cipro to [facility] on [address]. Pt verbalized understanding, all questions answered.

## 2019-12-22 ENCOUNTER — Emergency Department (HOSPITAL_COMMUNITY)
Admission: EM | Admit: 2019-12-22 | Discharge: 2019-12-22 | Disposition: A | Discharge status: Home / Self Care | Payer: Self-pay | Attending: Emergency Medicine | Admitting: Emergency Medicine

## 2019-12-22 ENCOUNTER — Other Ambulatory Visit: Payer: Self-pay

## 2019-12-22 ENCOUNTER — Encounter (HOSPITAL_COMMUNITY): Payer: Self-pay | Admitting: Emergency Medicine

## 2019-12-22 ENCOUNTER — Emergency Department (HOSPITAL_COMMUNITY): Payer: Self-pay

## 2019-12-22 DIAGNOSIS — N453 Epididymo-orchitis: Secondary | ICD-10-CM | POA: Insufficient documentation

## 2019-12-22 DIAGNOSIS — F17210 Nicotine dependence, cigarettes, uncomplicated: Secondary | ICD-10-CM | POA: Insufficient documentation

## 2019-12-22 LAB — URINALYSIS, ROUTINE W REFLEX MICROSCOPIC
Bilirubin Urine: NEGATIVE
Glucose, UA: NEGATIVE mg/dL
Hgb urine dipstick: NEGATIVE
Ketones, ur: NEGATIVE mg/dL
Leukocytes,Ua: NEGATIVE
Nitrite: NEGATIVE
Protein, ur: NEGATIVE mg/dL
Specific Gravity, Urine: 1.021 (ref 1.005–1.030)
pH: 7 (ref 5.0–8.0)

## 2019-12-22 MED ORDER — DOXYCYCLINE HYCLATE 100 MG PO CAPS: 100.0000 mg | ORAL_CAPSULE | Freq: Two times a day (BID) | ORAL | 0 refills | Status: DC

## 2019-12-22 MED ORDER — DOXYCYCLINE HYCLATE 100 MG PO TABS
100.0000 mg | ORAL_TABLET | Freq: Once | ORAL | Status: AC
  Administered 2019-12-22: 03:00:00 100 mg via ORAL
  Filled 2019-12-22: qty 1

## 2019-12-22 MED ORDER — STERILE WATER FOR INJECTION IJ SOLN
INTRAMUSCULAR | Status: AC
  Administered 2019-12-22: 1 mL
  Filled 2019-12-22: qty 10

## 2019-12-22 MED ORDER — ONDANSETRON 4 MG PO TBDP
4.0000 mg | ORAL_TABLET | Freq: Once | ORAL | Status: AC
  Administered 2019-12-22: 4 mg via ORAL
  Filled 2019-12-22: qty 1

## 2019-12-22 MED ORDER — ONDANSETRON 4 MG PO TBDP: 4.0000 mg | ORAL_TABLET | Freq: Four times a day (QID) | ORAL | 0 refills | Status: DC | PRN

## 2019-12-22 MED ORDER — IBUPROFEN 800 MG PO TABS: 800.0000 mg | ORAL_TABLET | Freq: Three times a day (TID) | ORAL | 0 refills | Status: DC | PRN

## 2019-12-22 MED ORDER — CEFTRIAXONE SODIUM 1 G IJ SOLR
500.0000 mg | Freq: Once | INTRAMUSCULAR | Status: AC
  Administered 2019-12-22: 500 mg via INTRAMUSCULAR
  Filled 2019-12-22: qty 10

## 2019-12-22 NOTE — ED Triage Notes (Signed)
Pt reports testicular swelling to right testicle that started 2 days ago.

## 2019-12-22 NOTE — ED Provider Notes (Signed)
TIME SEEN: 1:01 AM  CHIEF COMPLAINT: Right testicle pain, dysuria  HPI: Patient is a 29 year old male with no significant past medical history who presents to the emergency department with right testicular pain and swelling for the past 2 days.  No injury to this area.  Has never had similar symptoms.  Sexually active with one male partner and does not use protection.  No penile discharge but has had some dysuria.  No hematuria.  No history of STDs.  No fevers or abdominal pain.  No vomiting.  ROS: See HPI Constitutional: no fever  Eyes: no drainage  ENT: no runny nose   Cardiovascular:  no chest pain  Resp: no SOB  GI: no vomiting GU: no dysuria Integumentary: no rash  Allergy: no hives  Musculoskeletal: no leg swelling  Neurological: no slurred speech ROS otherwise negative  PAST MEDICAL HISTORY/PAST SURGICAL HISTORY:  History reviewed. No pertinent past medical history.  MEDICATIONS:  Prior to Admission medications   Medication Sig Start Date End Date Taking? Authorizing Provider  ibuprofen (ADVIL) 600 MG tablet Take 1 tablet (600 mg total) by mouth every 6 (six) hours as needed. 09/29/19   Lamptey, Myrene Galas, MD  methocarbamol (ROBAXIN) 500 MG tablet Take 1 tablet (500 mg total) by mouth 2 (two) times daily. 01/17/17   Tanna Furry, MD  naproxen (NAPROSYN) 500 MG tablet Take 1 tablet (500 mg total) by mouth 2 (two) times daily. 01/17/17   Tanna Furry, MD  Pseudoeph-Doxylamine-DM-APAP (NYQUIL PO) Take 30 mLs by mouth at bedtime.    [provider]  Pseudoephedrine-APAP-DM (DAYQUIL PO) Take 30 mLs by mouth daily as needed (cold).    [provider]  tamsulosin (FLOMAX) 0.4 MG CAPS capsule Take 1 capsule (0.4 mg total) by mouth daily. 09/29/19   Lamptey, Myrene Galas, MD  zolpidem (AMBIEN) 10 MG tablet Take 1 tablet (10 mg total) by mouth at bedtime as needed for sleep. 06/12/13 07/12/13  Theodis Blaze, MD    ALLERGIES:  No Known Allergies  SOCIAL HISTORY:  Social  History   Tobacco Use  . Smoking status: Current Every Day Smoker    Packs/day: 0.50    Years: 5.00    Pack years: 2.50    Types: Cigarettes  . Smokeless tobacco: Current User    Types: Chew  . Tobacco comment: smokes about a 1/2 pack a day  Substance Use Topics  . Alcohol use: No    FAMILY HISTORY: History reviewed. No pertinent family history.  EXAM: BP (!) 152/110 (BP Location: Left Arm)   Pulse 67   Temp 97.7 F (36.5 C) (Oral)   Resp 18   SpO2 98%  CONSTITUTIONAL: Alert and oriented and responds appropriately to questions. Well-appearing; well-nourished, in no significant distress HEAD: Normocephalic EYES: Conjunctivae clear, pupils appear equal, EOM appear intact ENT: normal nose; moist mucous membranes NECK: Supple, normal ROM CARD: RRR; S1 and S2 appreciated; no murmurs, no clicks, no rubs, no gallops RESP: Normal chest excursion without splinting or tachypnea; breath sounds clear and equal bilaterally; no wheezes, no rhonchi, no rales, no hypoxia or respiratory distress, speaking full sentences ABD/GI: Normal bowel sounds; non-distended; soft, non-tender, no rebound, no guarding, no peritoneal signs, no hepatosplenomegaly GU:  Normal external genitalia, circumcised male, normal penile shaft, no blood or discharge at the urethral meatus, patient has minimal right testicular swelling compared to the left and is tender to palpation over the testicle without obvious mass but exam limited secondary to tenderness, no scrotal masses  or swelling, no hernias appreciated, 2+ femoral pulses bilaterally; no perineal erythema, warmth, subcutaneous air or crepitus; no high riding testicle, normal bilateral cremasteric reflex.  Chaperone present for exam. EXT: Normal ROM in all joints; no deformity noted, no edema; no cyanosis SKIN: Normal color for age and race; warm; no rash on exposed skin NEURO: Moves all extremities equally PSYCH: The patient's mood and manner are appropriate.    MEDICAL DECISION MAKING: Patient here with right testicular pain.  Differential includes torsion, epididymitis, STD, UTI, less likely kidney stone.  Abdominal exam benign.  Doubt appendicitis.  No sign of hernia.  Declines pain medicine at this time.  Will obtain ultrasound, urine and urine culture with gonorrhea and chlamydia screening.  ED PROGRESS: Patient's ultrasound shows right epididymitis and right orchitis.  Also is a small right hydrocele.  No testicular mass or torsion.  Given he is a sexually active male under 100, biggest concern is for gonorrhea and chlamydia.  Discussed this with patient.  He has unprotected sex but is monogamous.  Will cover with Rocephin and doxycycline.  Will give outpatient urologic follow-up.  He still is declining pain medicine at this time.  Recommended NSAIDs for discomfort.  Urine shows no sign of infection today.  Culture and gonorrhea and chlamydia test pending.  I feel he is safe for discharge home.     Joshua Guerra was evaluated in Emergency Department on 12/22/2019 for the symptoms described in the history of present illness. He was evaluated in the context of the global COVID-19 pandemic, which necessitated consideration that the patient might be at risk for infection with the SARS-CoV-2 virus that causes COVID-19. Institutional protocols and algorithms that pertain to the evaluation of patients at risk for COVID-19 are in a state of rapid change based on information released by regulatory bodies including the CDC and federal and state organizations. These policies and algorithms were followed during the patient's care in the ED.  Patient was seen wearing N95, face shield, gloves.    Lajean Boese, Layla Maw, DO 12/22/19 [phone]

## 2019-12-23 LAB — URINE CULTURE: Culture: NO GROWTH

## 2020-07-15 ENCOUNTER — Encounter: Payer: Self-pay | Admitting: Emergency Medicine

## 2020-07-15 ENCOUNTER — Other Ambulatory Visit: Payer: Self-pay

## 2020-07-15 ENCOUNTER — Ambulatory Visit
Admission: EM | Admit: 2020-07-15 | Discharge: 2020-07-15 | Disposition: A | Discharge status: Home / Self Care | Payer: Self-pay | Attending: Emergency Medicine | Admitting: Emergency Medicine

## 2020-07-15 DIAGNOSIS — N368 Other specified disorders of urethra: Secondary | ICD-10-CM

## 2020-07-15 DIAGNOSIS — K5900 Constipation, unspecified: Secondary | ICD-10-CM

## 2020-07-15 LAB — POCT URINALYSIS DIP (MANUAL ENTRY)
Bilirubin, UA: NEGATIVE
Blood, UA: NEGATIVE
Glucose, UA: NEGATIVE mg/dL
Ketones, POC UA: NEGATIVE mg/dL
Leukocytes, UA: NEGATIVE
Nitrite, UA: NEGATIVE
Protein Ur, POC: NEGATIVE mg/dL
Spec Grav, UA: >=1.03 — AB (ref 1.010–1.025)
Urobilinogen, UA: 0.2 E.U./dL
pH, UA: 6 (ref 5.0–8.0)

## 2020-07-15 MED ORDER — KETOROLAC TROMETHAMINE 10 MG PO TABS: 10.0000 mg | ORAL_TABLET | Freq: Four times a day (QID) | ORAL | 0 refills | Status: AC | PRN

## 2020-07-15 NOTE — ED Triage Notes (Signed)
Pt here for 1 week of abd pain with some constipation and feeling of needing to have BM which he took mag citrate for with results; pt sts still feels need to have BM but only had watery diarrhea yesterday; pt sts some intermittent dysuria as well

## 2020-07-15 NOTE — ED Provider Notes (Signed)
[facility]    CSN: [id number] Arrival date & time: 07/15/20  6967      History   Chief Complaint Chief Complaint  Patient presents with  . Abdominal Pain    HPI Joshua Guerra is a 29 y.o. male presenting for LLQ discomfort and constipation x1 week.  States has been intermittent, slowly getting worse.  Took magnesium citrate with some relief.  No nausea, vomiting, fever, hematuria, hematochezia, melena.  Patient does note intermittent dysuria: No penile pain, testicular pain or swelling, penile discharge, concern for STI.  Last episode that was similar to this was 09/29/2019: Please see those UC notes reviewed by me at time of visit.  History reviewed. No pertinent past medical history.  Patient Active Problem List   Diagnosis Date Noted  . Rash and nonspecific skin eruption 06/26/2013  . Pruritus 06/26/2013  . Hyponatremia 06/26/2013  . Anemia 06/26/2013    Past Surgical History:  Procedure Laterality Date  . LEG SURGERY     2 screws in left leg, 2010       Home Medications    Prior to Admission medications   Medication Sig Start Date End Date Taking? Authorizing Provider  ketorolac (TORADOL) 10 MG tablet Take 1 tablet (10 mg total) by mouth every 6 (six) hours as needed. 07/15/20   Hall-Potvin, [place], PA-C  zolpidem (AMBIEN) 10 MG tablet Take 1 tablet (10 mg total) by mouth at bedtime as needed for sleep. Patient not taking: Reported on 12/22/2019 06/12/13 07/15/20  Dorothea Ogle, MD    Family History History reviewed. No pertinent family history.  Social History Social History   Tobacco Use  . Smoking status: Current Every Day Smoker    Packs/day: 0.50    Years: 5.00    Pack years: 2.50    Types: Cigarettes  . Smokeless tobacco: Current User    Types: Chew  . Tobacco comment: smokes about a 1/2 pack a day  Substance Use Topics  . Alcohol use: No  . Drug use: No     Allergies   Patient has no known allergies.    Review of Systems As per HPI   Physical Exam Triage Vital Signs ED Triage Vitals [07/15/20 0826]  Enc Vitals Group     BP 119/80     Pulse Rate 61     Resp 18     Temp 97.9 F (36.6 C)     Temp Source Oral     SpO2 97 %     Weight      Height      Head Circumference      Peak Flow      Pain Score 6     Pain Loc      Pain Edu?      Excl. in GC?    No data found.  Updated Vital Signs BP 119/80 (BP Location: Left Arm)   Pulse 61   Temp 97.9 F (36.6 C) (Oral)   Resp 18   SpO2 97%   Visual Acuity Right Eye Distance:   Left Eye Distance:   Bilateral Distance:    Right Eye Near:   Left Eye Near:    Bilateral Near:     Physical Exam Constitutional:      General: He is not in acute distress.    Appearance: He is well-developed and normal weight. He is not ill-appearing.  HENT:     Head: Normocephalic and atraumatic.  Eyes:  General: No scleral icterus.    Pupils: Pupils are equal, round, and reactive to light.  Cardiovascular:     Rate and Rhythm: Normal rate.  Pulmonary:     Effort: Pulmonary effort is normal. No respiratory distress.     Breath sounds: No wheezing.  Abdominal:     General: Abdomen is flat. Bowel sounds are normal. There is no distension.     Palpations: Abdomen is soft. There is no hepatomegaly or splenomegaly.     Tenderness: There is abdominal tenderness in the left lower quadrant. There is no right CVA tenderness, left CVA tenderness, guarding or rebound. Negative signs include Murphy's sign, Rovsing's sign and McBurney's sign.     Hernia: No hernia is present.  Skin:    Coloration: Skin is not jaundiced or pale.  Neurological:     Mental Status: He is alert and oriented to person, place, and time.      UC Treatments / Results  Labs (all labs ordered are listed, but only abnormal results are displayed) Labs Reviewed  POCT URINALYSIS DIP (MANUAL ENTRY) - Abnormal; Notable for the following components:      Result Value    Spec Grav, UA >=1.030 (*)    All other components within normal limits    EKG   Radiology No results found.  Procedures Procedures (including critical care time)  Medications Ordered in UC Medications - No data to display  Initial Impression / Assessment and Plan / UC Course  I have reviewed the triage vital signs and the nursing notes.  Pertinent labs & imaging results that were available during my care of the patient were reviewed by me and considered in my medical decision making (see chart for details).     Patient afebrile, nontoxic in office today.  No signs/symptoms of acute abdomen.  Urine with elevated specific gravity, otherwise unremarkable.  Likely ureteral stone and constipation.  Will treat supportively as outlined below.  Provided strainer & contact information for urologic follow-up.  Return precautions discussed, pt verbalized understanding and is agreeable to plan. Final Clinical Impressions(s) / UC Diagnoses   Final diagnoses:  Constipation, unspecified constipation type  Urethral colic     Discharge Instructions     Please review constipation handout. Important plenty of water throughout the day: This will help keep stool moving and soft, as well as push any stones out. Use strainer when you urinate to see if you catch any stones: If you do, very important follow-up with urology. May take Toradol as directed for pain. Go to ER for worsening pain, blood in urine or stool, fever, vomiting.    ED Prescriptions    Medication Sig Dispense Auth. Provider   ketorolac (TORADOL) 10 MG tablet Take 1 tablet (10 mg total) by mouth every 6 (six) hours as needed. 20 tablet Hall-Potvin, [place], PA-C     PDMP not reviewed this encounter.   Hall-Potvin, [place], [place] 07/15/20 [phone]

## 2020-07-15 NOTE — Discharge Instructions (Signed)
Please review constipation handout. Important plenty of water throughout the day: This will help keep stool moving and soft, as well as push any stones out. Use strainer when you urinate to see if you catch any stones: If you do, very important follow-up with urology. May take Toradol as directed for pain. Go to ER for worsening pain, blood in urine or stool, fever, vomiting.

## 2020-08-30 IMAGING — US US SCROTUM W/ DOPPLER COMPLETE
1 series · 13 of 25 positions shown · non-contrast
Comparison: None.

CLINICAL DATA: Right testicular pain for 2 days with swelling

EXAM:
SCROTAL ULTRASOUND
DOPPLER ULTRASOUND OF THE TESTICLES
TECHNIQUE: Complete ultrasound examination of the testicles, epididymis, and
other scrotal structures was performed. Color and spectral Doppler
ultrasound were also utilized to evaluate blood flow to the
testicles.

[Series 1: us scrotum w/ doppler complete · 78 acquisitions, 13 frames shown]
[im 1/78]
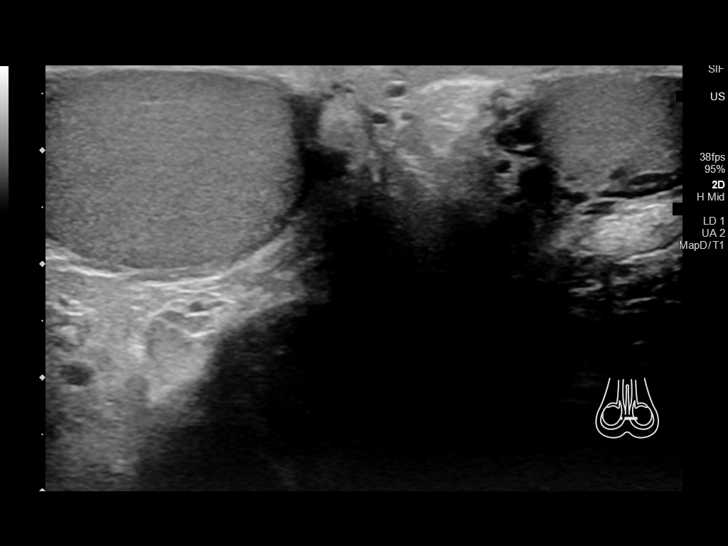
[im 7/78]
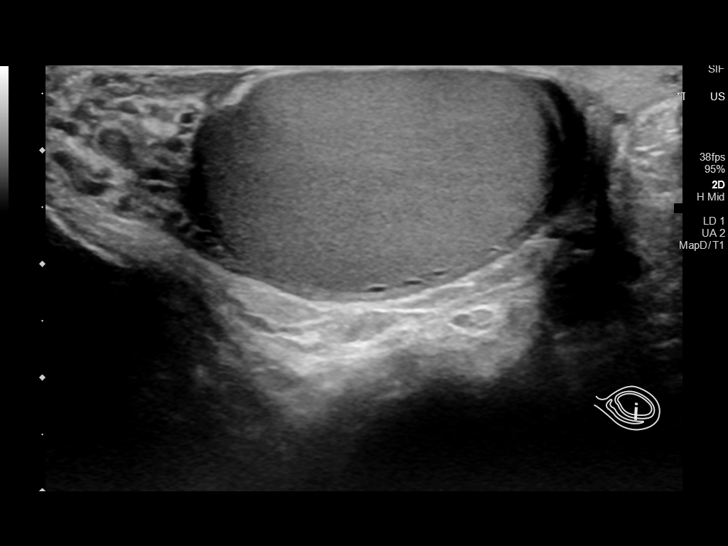
[im 13/78]
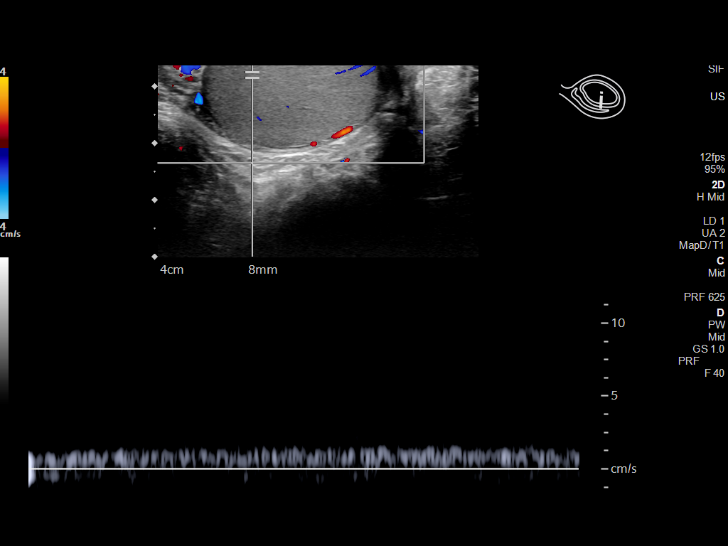
[im 20/78]
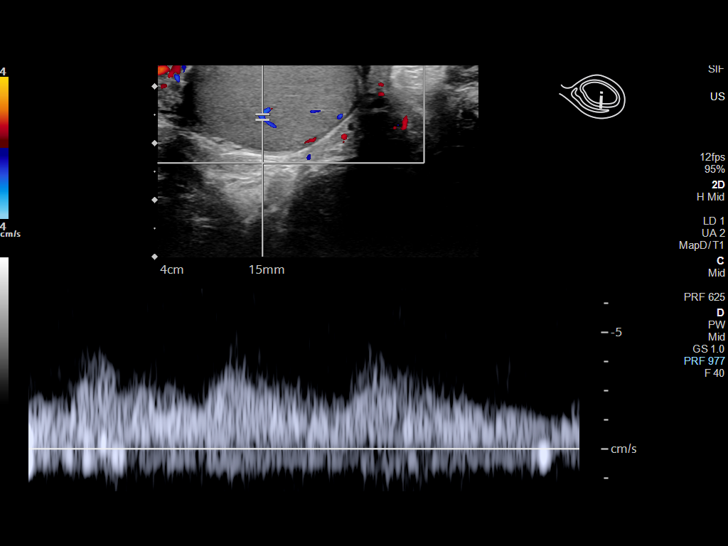
[im 26/78]
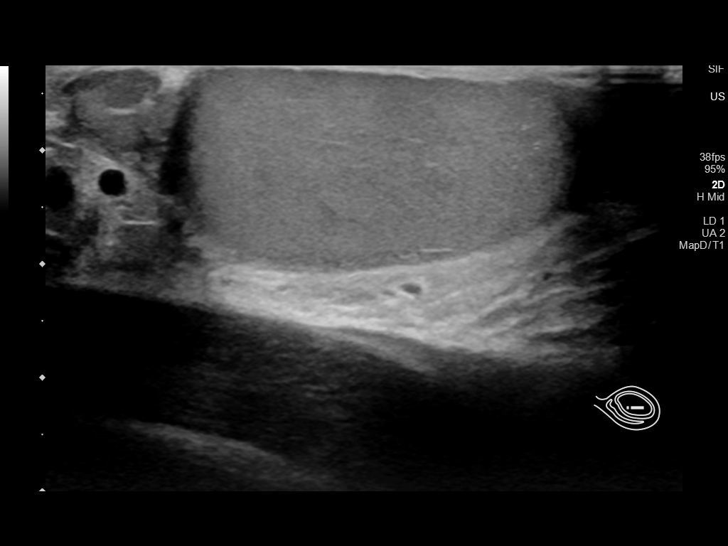
[im 33/78]
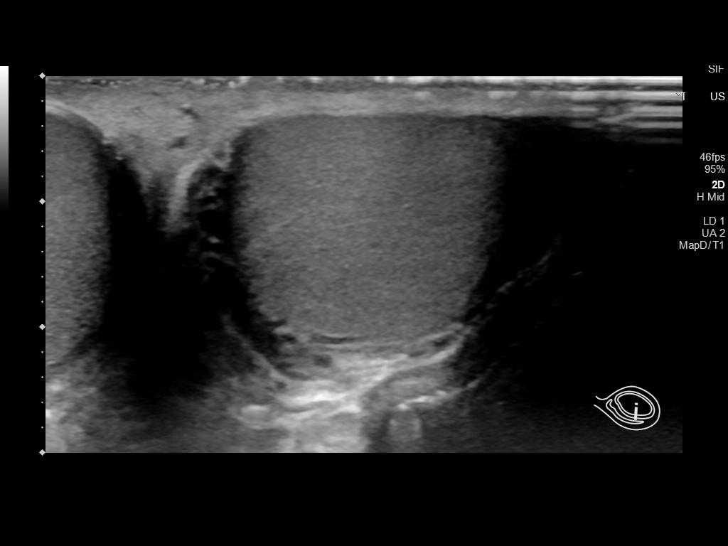
[im 39/78]
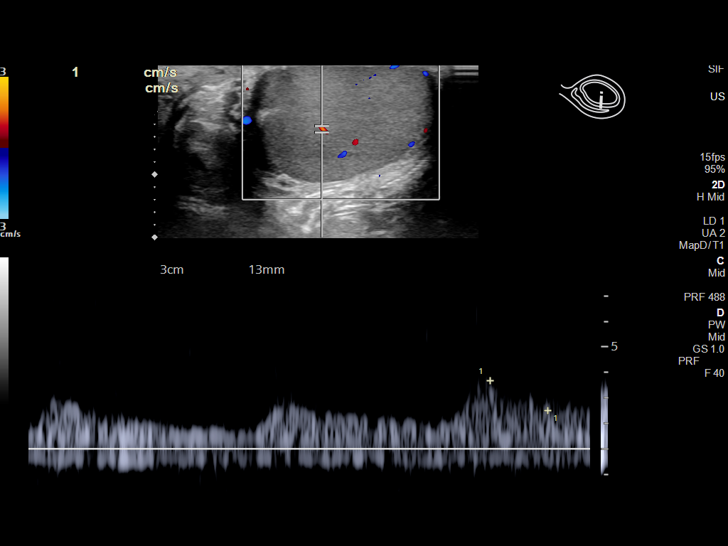
[im 45/78]
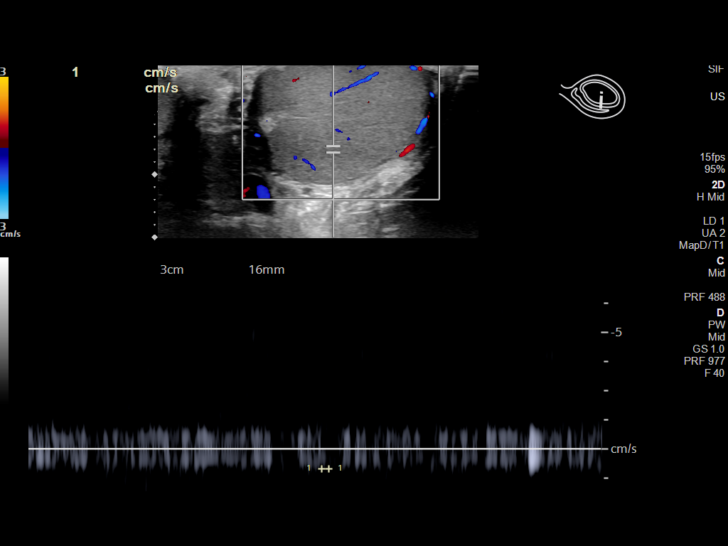
[im 52/78]
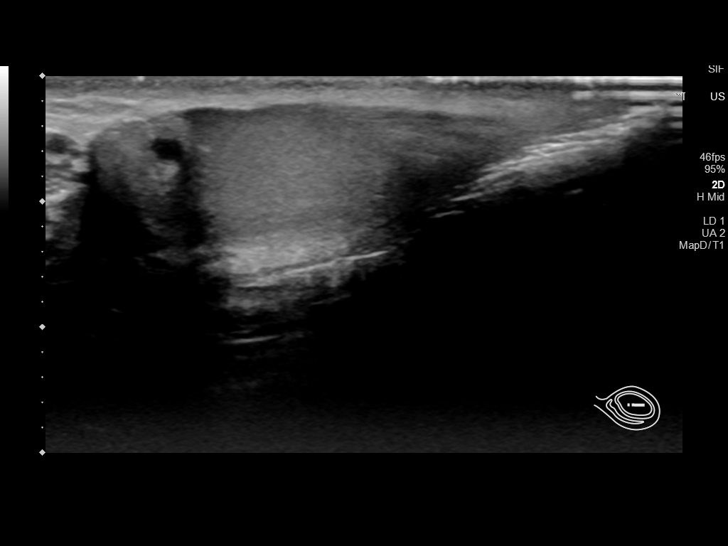
[im 58/78]
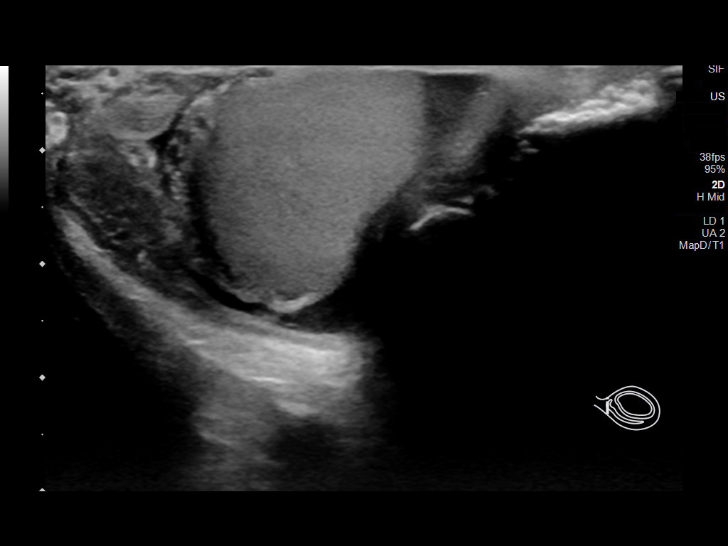
[im 65/78]
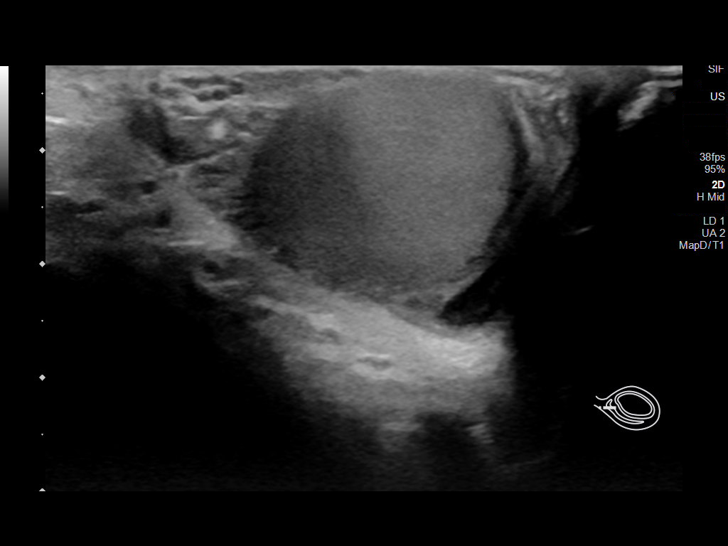
[im 71/78]
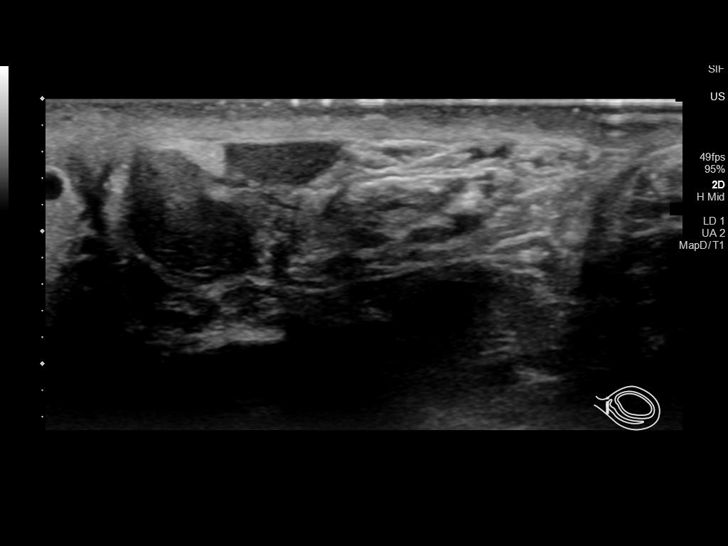
[im 78/78]
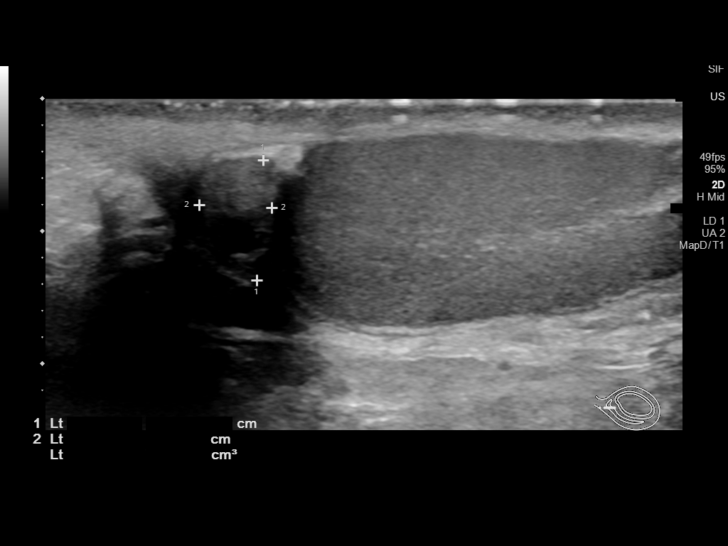

[13 of 25 positions shown; findings below may reference images not displayed]

FINDINGS: Right testicle

Measurements: 4.0 x 1.9 x 2.9 cm. No mass or microlithiasis
visualized.

Left testicle

Measurements: 2.9 x 1.7 x 2.6 cm. No mass or microlithiasis
visualized.

Right epididymis: Appears asymmetrically enlarged and hypervascular
measuring up to 1.3 cm in width.

Left epididymis: Small 3 mm anechoic left epididymal head cyst.
Remainder of the epididymal parenchyma is unremarkable.

Hydrocele:  Small right hydrocele.

Varicocele:  None visualized.

Pulsed Doppler interrogation of both testes demonstrates normal low
resistance arterial and venous waveforms bilaterally.
IMPRESSION: Features concerning for right epididymitis without convincing
features of right orchitis.

Small right hydrocele, sterility is not ascertained on imaging.

3 mm left epididymal head cyst, benign incidental finding.

No evidence of testicular mass or testicular torsion.

## 2024-05-29 NOTE — Progress Notes (Signed)
 Referring-Cameron Research scientist (life sciences), NP Reason for referral-palpitations  HPI: 33 year old male for evaluation of palpitations at request of Ole Pepper, NP.  Monitor March 2025 showed sinus rhythm with rare PACs and PVCs; Mobitz 1 second-degree AV block.  Symptoms were associated with normal sinus rhythm.  Echocardiogram at [facility] April 2025 showed normal LV function and no significant valvular disease.  Sleep study June 2025 showed no sleep apnea.  Cardiology now asked to evaluate.  Patient states that he has had intermittent palpitations described as his heart racing.  This can last for hours.  It is sudden in onset.  Recently had an episode in felt dizzy with this.  He otherwise denies dyspnea on exertion, orthopnea, PND, pedal edema, exertional chest pain and no syncope.  Current Outpatient Medications  Medication Sig Dispense Refill   ergocalciferol (VITAMIN D2) 1.25 MG (50000 UT) capsule Take 50,000 Units by mouth once a week.     fexofenadine (ALLEGRA) 180 MG tablet Take 180 mg by mouth daily.     vitamin B-12 (CYANOCOBALAMIN) 500 MCG tablet Take 500 mcg by mouth daily.     dicyclomine (BENTYL) 20 MG tablet Take 20 mg by mouth every 6 (six) hours.     ketorolac  (TORADOL ) 10 MG tablet Take 1 tablet (10 mg total) by mouth every 6 (six) hours as needed. 20 tablet 0   triamcinolone (NASACORT ALLERGY 24HR) 55 MCG/ACT AERO nasal inhaler Place 2 sprays into the nose daily.     No current facility-administered medications for this visit.    No Known Allergies   Past Medical History:  Diagnosis Date   Elevated ferritin    Family history of cardiac disorder in father    FHx: SVT (supraventricular tachycardia)    Heart palpitations    Hypertriglyceridemia    Night sweats    Seasonal allergic rhinitis    Second degree AV block, Mobitz type I    Snoring    Vitamin D deficiency     Past Surgical History:  Procedure Laterality Date   LEG SURGERY     2 screws in left leg, 2010    Social  History   Socioeconomic History   Marital status: Single    Spouse name: Not on file   Number of children: Not on file   Years of education: Not on file   Highest education level: Not on file  Occupational History   Not on file  Tobacco Use   Smoking status: Former    Current packs/day: 0.50    Average packs/day: 0.5 packs/day for 5.0 years (2.5 ttl pk-yrs)    Types: Cigarettes   Smokeless tobacco: Current    Types: Chew   Tobacco comments:    06/06/2024 Patient chew tobacco daily    smokes about a 1/2 pack a day  Substance and Sexual Activity   Alcohol use: No   Drug use: No   Sexual activity: Not on file  Other Topics Concern   Not on file  Social History Narrative   Not on file   Social Drivers of Health   Financial Resource Strain: Not on file  Food Insecurity: Low Risk  (02/09/2024)   Received from [facility]   Hunger Vital Sign    Within the past 12 months, you worried that your food would run out before you got money to buy more: Never true    Within the past 12 months, the food you bought just didn't last and you didn't have money to get  more. : Never true  Transportation Needs: No Transportation Needs (02/09/2024)   Received from [facility]    In the past 12 months, has lack of reliable transportation kept you from medical appointments, meetings, work or from getting things needed for daily living? : No  Physical Activity: Not on file  Stress: Not on file  Social Connections: Not on file  Intimate Partner Violence: Not on file    Family History  Problem Relation Age of Onset   Cancer Mother    Hypertension Father    Cancer Father     ROS: no fevers or chills, productive cough, hemoptysis, dysphasia, odynophagia, melena, hematochezia, dysuria, hematuria, rash, seizure activity, orthopnea, PND, pedal edema, claudication. Remaining systems are negative.  Physical Exam:   Blood pressure 120/64, pulse 63, height 5' 9 (1.753 m),  weight 221 lb 6.4 oz (100.4 kg), SpO2 97%.  General:  Well developed/well nourished in NAD Skin warm/dry Patient not depressed No peripheral clubbing Back-normal HEENT-normal/normal eyelids Neck supple/normal carotid upstroke bilaterally; no bruits; no JVD; no thyromegaly chest - CTA/ normal expansion CV - RRR/normal S1 and S2; no murmurs, rubs or gallops;  PMI nondisplaced Abdomen -NT/ND, no HSM, no mass, + bowel sounds, no bruit 2+ femoral pulses, no bruits Ext-no edema, chords, 2+ DP Neuro-grossly nonfocal  EKG Interpretation Date/Time:  Thursday June 06 2024 11:08:26 EDT Ventricular Rate:  63 PR Interval:  170 QRS Duration:  104 QT Interval:  404 QTC Calculation: 413 R Axis:   62  Text Interpretation: Normal sinus rhythm with sinus arrhythmia Normal ECG Confirmed by Pietro Rogue ([id number]) on 06/06/2024 11:13:54 AM    A/P  1 palpitations-etiology unclear.  Not clear that he had symptoms at time of recent monitor.  We discussed recording strips with a smart watch associated with his symptoms.  He will send those to us  through MyChart.  Note his LV function is normal.  I will avoid beta-blockade as he had Mobitz 1 second-degree AV block in the early a.m. hours.  2 hypertriglyceridemia-continue diet.  Rogue Pietro, MD

## 2024-06-06 ENCOUNTER — Encounter: Payer: Self-pay | Admitting: Cardiology

## 2024-06-06 ENCOUNTER — Ambulatory Visit: Attending: Cardiology | Admitting: Cardiology

## 2024-06-06 VITALS — BP 120/64 | HR 63 | Ht 69.0 in | Wt 221.4 lb

## 2024-06-06 DIAGNOSIS — R002 Palpitations: Secondary | ICD-10-CM | POA: Diagnosis not present

## 2024-06-06 DIAGNOSIS — Z8249 Family history of ischemic heart disease and other diseases of the circulatory system: Secondary | ICD-10-CM

## 2024-06-06 DIAGNOSIS — I471 Supraventricular tachycardia, unspecified: Secondary | ICD-10-CM | POA: Diagnosis not present

## 2024-06-06 DIAGNOSIS — I441 Atrioventricular block, second degree: Secondary | ICD-10-CM | POA: Diagnosis not present

## 2024-06-06 NOTE — Patient Instructions (Signed)
   Follow-Up: At [facility], you and your health needs are our priority.  As part of our continuing mission to provide you with exceptional heart care, our providers are all part of one team.  This team includes your primary Cardiologist (physician) and Advanced Practice Providers or APPs (Physician Assistants and Nurse Practitioners) who all work together to provide you with the care you need, when you need it.  Your next appointment:   6 month(s)  Provider:   Redell Shallow MD

## 2024-12-04 ENCOUNTER — Encounter: Payer: Self-pay | Admitting: Cardiology
